# Patient Record
Sex: Male | Born: 1980 | Race: Black or African American | Hispanic: No | Marital: Single | Smoking: Former smoker
Health system: Southern US, Community
[De-identification: ages and names within clinical notes are randomized; demographics above are authoritative.]

## PROBLEM LIST (undated history)

## (undated) DIAGNOSIS — I509 Heart failure, unspecified: Secondary | ICD-10-CM

## (undated) DIAGNOSIS — F1910 Other psychoactive substance abuse, uncomplicated: Secondary | ICD-10-CM

## (undated) DIAGNOSIS — I10 Essential (primary) hypertension: Secondary | ICD-10-CM

## (undated) HISTORY — DX: Other psychoactive substance abuse, uncomplicated: F19.10

---
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
## 2005-07-12 ENCOUNTER — Emergency Department (HOSPITAL_COMMUNITY): Admission: EM | Admit: 2005-07-12 | Discharge: 2005-07-12 | Payer: Self-pay | Admitting: Family Medicine

## 2005-07-14 ENCOUNTER — Emergency Department (HOSPITAL_COMMUNITY): Admission: EM | Admit: 2005-07-14 | Discharge: 2005-07-14 | Payer: Self-pay | Admitting: Family Medicine

## 2006-01-20 ENCOUNTER — Encounter: Payer: Self-pay | Admitting: Emergency Medicine

## 2008-02-09 ENCOUNTER — Emergency Department (HOSPITAL_COMMUNITY): Admission: EM | Admit: 2008-02-09 | Discharge: 2008-02-09 | Payer: Self-pay | Admitting: Emergency Medicine

## 2008-02-10 ENCOUNTER — Inpatient Hospital Stay (HOSPITAL_COMMUNITY): Admission: EM | Admit: 2008-02-10 | Discharge: 2008-02-20 | Payer: Self-pay | Admitting: Emergency Medicine

## 2008-02-13 ENCOUNTER — Ambulatory Visit: Payer: Self-pay | Admitting: Infectious Disease

## 2008-03-05 ENCOUNTER — Inpatient Hospital Stay (HOSPITAL_COMMUNITY): Admission: EM | Admit: 2008-03-05 | Discharge: 2008-03-08 | Payer: Self-pay | Admitting: Family Medicine

## 2009-08-04 IMAGING — CT CT EXTREM LOW W/ CM*R*
3 of 4 series · 16 of 33 positions shown, 19 images · IV contrast (agent unspecified)
Comparison: 02/16/2008

CLINICAL DATA: Recent discharge for cellulitis.  Worsening
swelling.

CT RIGHT LOWER EXTREMITY WITH CONTRAST
TECHNIQUE: Multidetector CT imaging of the right lower extremity
was performed according to the standard protocol following
intravenous contrast administration. Multiplanar CT image
reconstructions were also generated.
Contrast: 100 ml 1mnipaque-IJJ

[Series 5: lowextremity 3.0 b20s · axial · 0.42mm/px · z∈[-1060,-672]mm · 8 of 159 slices shown, 10 images]
[im 15/159  soft-tissue]
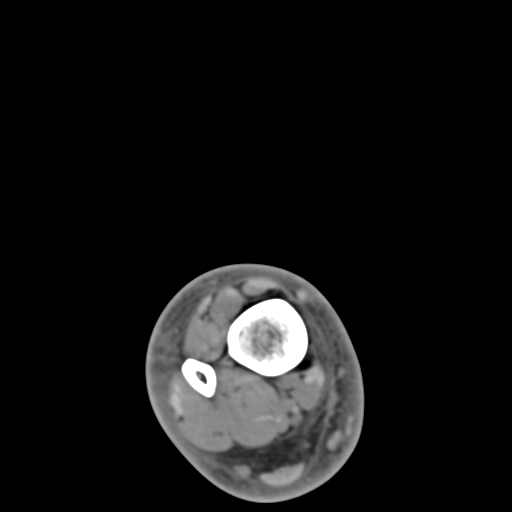
[im 15/159  bone]
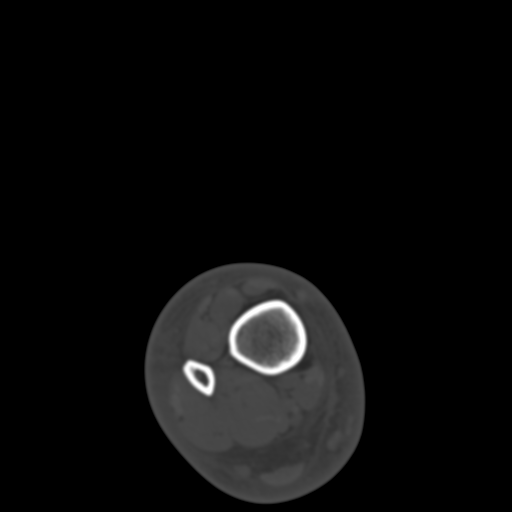
[im 29/159  bone]
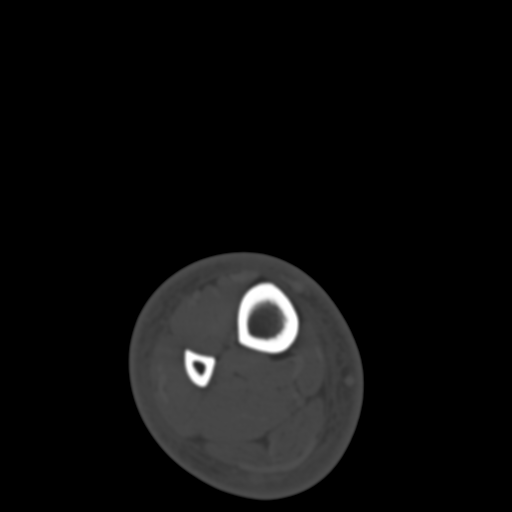
[im 58/159  bone]
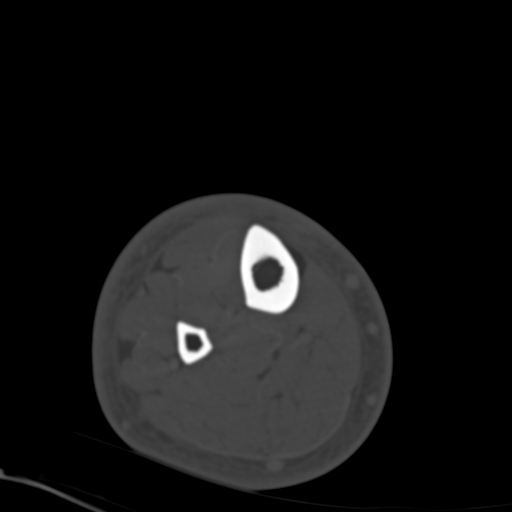
[im 72/159  bone]
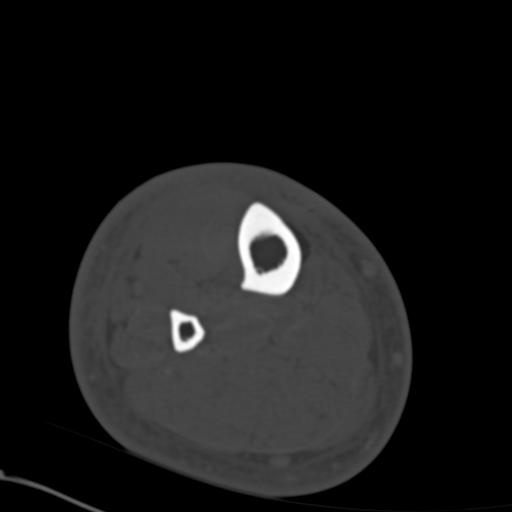
[im 87/159  soft-tissue]
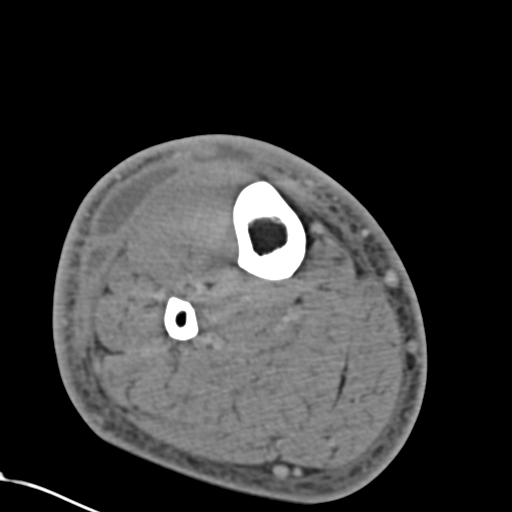
[im 87/159  bone]
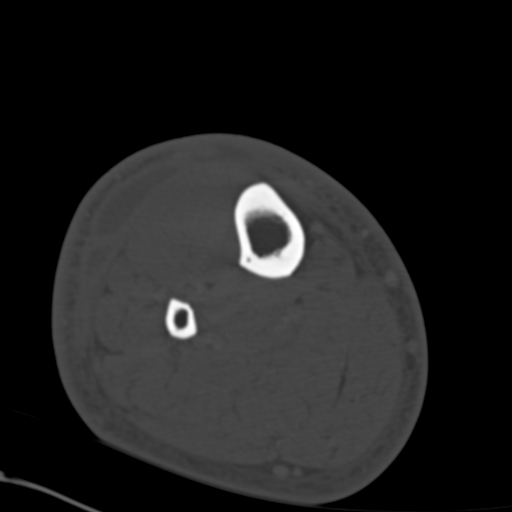
[im 101/159  bone]
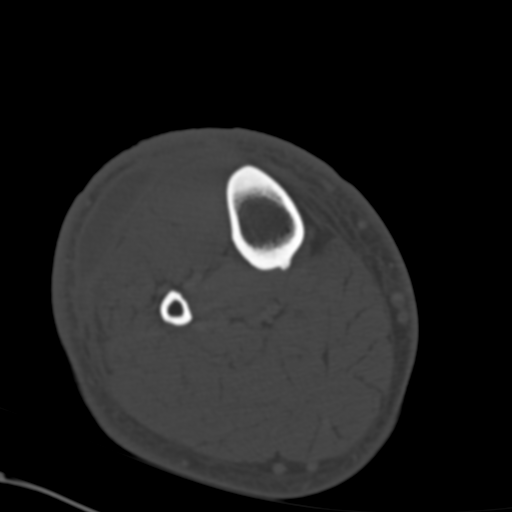
[im 130/159  bone]
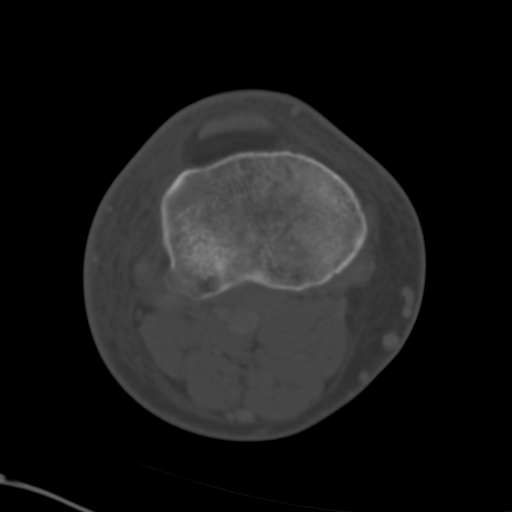
[im 144/159  bone]
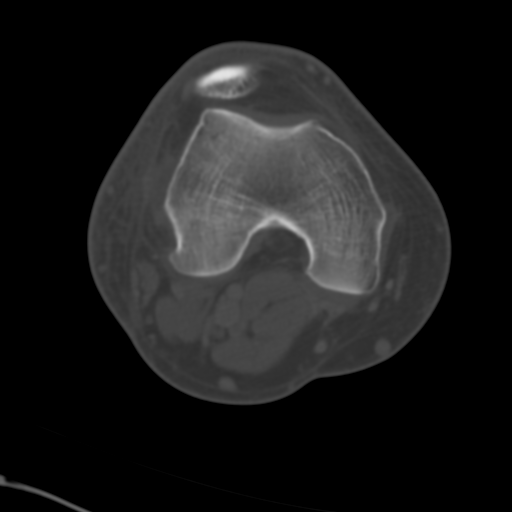

[Series 602: coronal soft tissue · coronal · 0.93mm/px · 3 of 53 slices shown]
[im 11/53  bone]
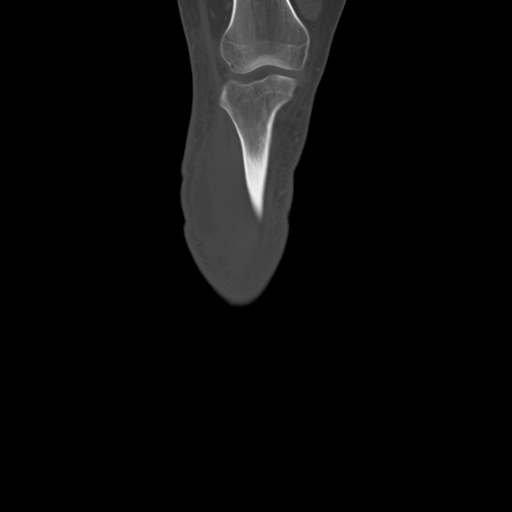
[im 21/53  bone]
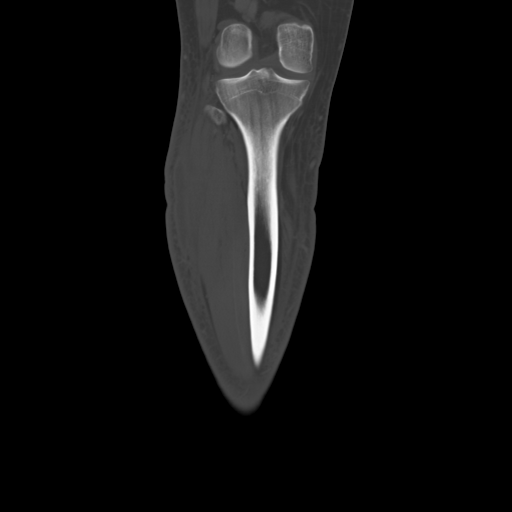
[im 32/53  bone]
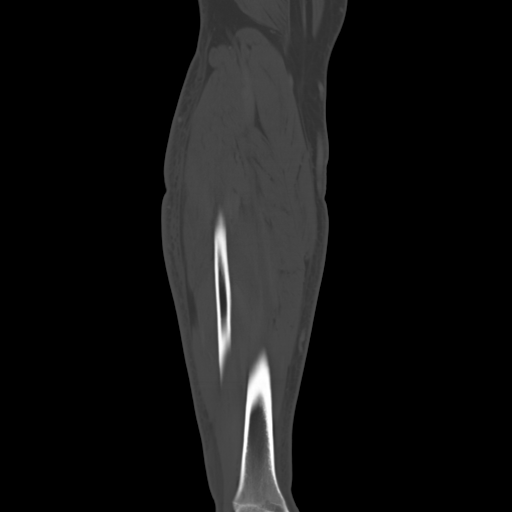

[Series 603: sagittal soft tissue · sagittal · 0.93mm/px · 5 of 49 slices shown, 6 images]
[im 17/49  bone]
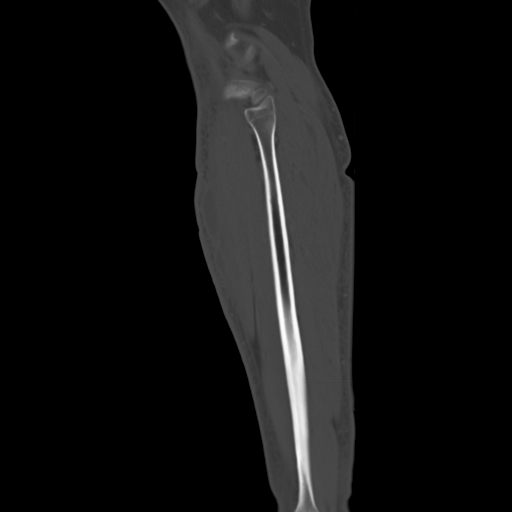
[im 21/49  bone]
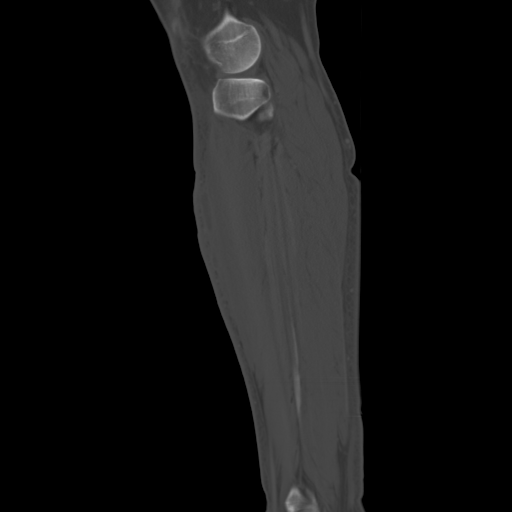
[im 25/49  soft-tissue]
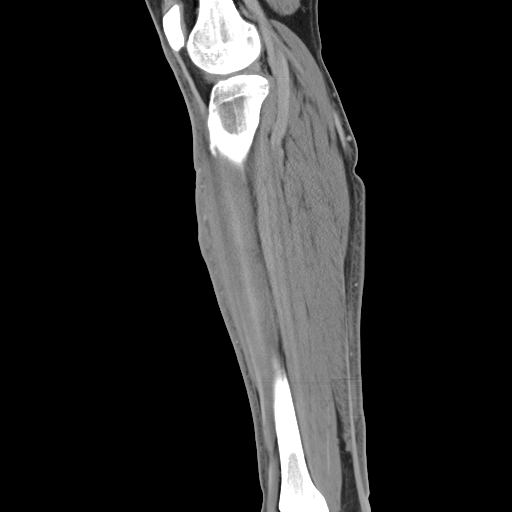
[im 25/49  bone]
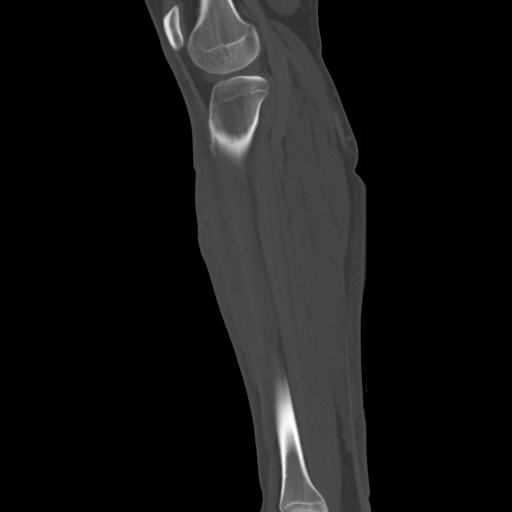
[im 29/49  bone]
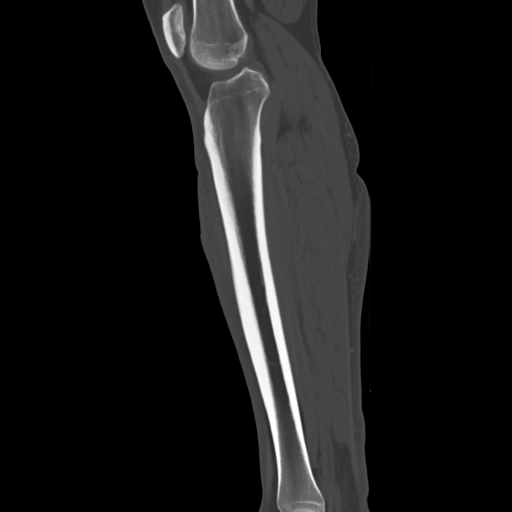
[im 33/49  bone]
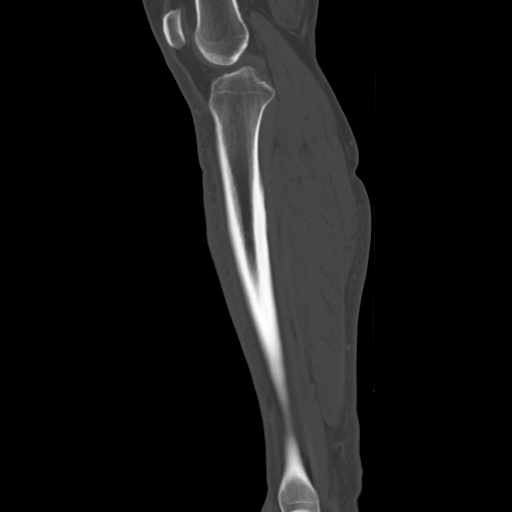

[16 of 33 positions shown; findings below may reference images not displayed]

FINDINGS: There is a tiny amount of joint fluid in the knee, not
particularly worrisome.  There is diffuse subcutaneous edema
throughout the remainder of the lower leg.  Anterolateral, within
the subcutaneous tissues, there is a crescentic low density
collection extending over a length of 13 cm with maximal transverse
diameter of 1.2 x 6 cm.  This is consistent with abscess formation.
This does not appear to involve the deep compartments.  No sign of
osteomyelitis.
IMPRESSION: Diffuse subcutaneous edema.  Nonenhancing fluid collection
anterolateral in the subcutaneous region in the proximal portion of
the lower leg consistent with an abscess.  This extends over a
cephalocaudal length of 13 cm and has a transverse diameter of
x 6 cm.

## 2010-03-31 ENCOUNTER — Emergency Department (HOSPITAL_COMMUNITY): Admission: EM | Admit: 2010-03-31 | Discharge: 2010-03-31 | Payer: Self-pay | Admitting: Family Medicine

## 2011-02-03 NOTE — H&P (Signed)
NAME:  Mitchell Ochoa, LOR NO.:  [id number]   MEDICAL RECORD NO.:  [id number]          PATIENT TYPE:  OBV   LOCATION:  5001                         FACILITY:  [facility]   PHYSICIAN:  Vanita Panda. Magnus Ivan, M.D.DATE OF BIRTH:  1981/05/30   DATE OF ADMISSION:  03/05/2008  DATE OF DISCHARGE:                              HISTORY & PHYSICAL   ADMITTING DIAGNOSIS:  Right leg abscess.   HISTORY OF PRESENT ILLNESS:  Briefly, Mitchell Ochoa is a 30 year old male who  was in the hospital on the General Medicine Service from late May all  the way up until February 20, 2008.  This was originally for a right leg  cellulitis.  He was on several antibiotics including Zyvox and was  eventually discharged on February 20, 2008.  His discharge medications  included doxycycline and Keflex.  He had not had a followup performed by  either the Infectious Disease Clinic or his medicine physician.  At 30  years old, he is an obese individual and had also been diagnosed with  high blood pressure and was started on hydrochlorothiazide.  Today, he  presented to the emergency room with worsening pain in his leg and some  tightness.  He states he do not really feel sick and the pain was only  minimal, but a CAT scan was obtained.  This was read as likely abscess  now in this area of his right leg and Orthopedic Surgery was consult.  I  was on unassigned call who was given the consultation.  Apparently, the  notes from his previous discharge showed that he had an orthopedic  consultation at that time, but it was unsure of who saw him, and since I  was on an unassigned call, this defaulted to me.  I talked with the  patient in length about his findings and if he had any other potential  wounds anywhere out of his body and he said he was doing well otherwise.   PAST MEDICAL HISTORY:  1. High blood pressure  2. Cellulitis, right leg.   ALLERGIES:  No known drug allergies.   MEDICATIONS:  1. Hydrochlorothiazide.  2.  Doxycycline.  3. Keflex.   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  He does smoke and drinks occasionally.   REVIEW OF SYSTEMS:  Negative for chest pain, shortness of breath, fever,  chills, nausea, or vomiting.   PHYSICAL EXAMINATION:  VITAL SIGNS:  His temperature is 97, his blood  pressure is 160/102, heart rate 70, and respiratory rate 16.  GENERAL:  He is alert and oriented 30 year old male in no acute distress  and only mild discomfort.  HEENT:  Normocephalic and atraumatic.  Pupils are equal, round, and  reactive to light.  LUNGS:  Clear to auscultation bilaterally.  HEART:  Regular rate and rhythm.  ABDOMEN:  Obese.  EXTREMITY:  Right lower extremity shows an orange-peel type of surface  to the anterior lateral tibia.  There is a small wound in this area that  is draining light fluid.  He can feel that it is  tighter on the  anterolateral aspect of the tibia.  He has good function of his knee,  foot, and ankle.  His foot has normal sensation.  He moves his toes  easily.   The CAT scan is reviewed and compared to previous studies and does show  collection of fluid in the subcutaneous and deeper tissues of the  anterolateral tibia proximally.  This does not appear to communicate  with bone and is consistent with an abscess.   LABORATORY STUDIES:  His white blood count is only 5.6.   IMPRESSION:  This is a 30 year old with a questionable right proximal  leg abscess.   PLAN:  I talked to him at length about the need for irrigation and  debridement considering this as a fluid collection there.  I talked with  his mother about this as well and they agreed for me to proceed with  surgery.  He will be admitted to the regular floor bed following surgery  with a drain in place.  Cultures will be taken and pending.  However, he  has been on antibiotics for a while now, so I will not be surprised if  the cultures end up being  negative.  I will obtain an ID consult as  appropriate and leave the drain in for a day or two.      Vanita Panda. Magnus Ivan, M.D.  Electronically Signed     CYB/MEDQ  D:  03/05/2008  T:  03/06/2008  Job:  [id number]

## 2011-02-03 NOTE — H&P (Signed)
NAME:  Mitchell Ochoa, Mitchell Ochoa NO.:  [id number]   MEDICAL RECORD NO.:  [id number]          PATIENT TYPE:  INP   LOCATION:  5001                         FACILITY:  [facility]   PHYSICIAN:  Della Goo, M.D. DATE OF BIRTH:  [birth year]   DATE OF ADMISSION:  02/10/2008  DATE OF DISCHARGE:                              HISTORY & PHYSICAL   ADMISSION DATE:  Feb 10, 2008.   CHIEF COMPLAINT:  Redness, swelling, right lower leg.   HISTORY OF PRESENT ILLNESS:  This is a 30 year old male presenting to  the emergency department with complaints of increased redness, swelling  and pain of the right lower leg over the past 4 days.  He reports the  swelling has been progressive.  He reports having a possible spider  bite/insect bite approximately 6 days ago.  He reports having fevers and  chills along with nausea and malaise and poor appetite.   PAST MEDICAL HISTORY:  Significant for asthma.   MEDICATIONS AT THIS TIME:  None.   ALLERGIES:  NO KNOWN DRUG ALLERGIES.   PAST SURGICAL HISTORY:  History of a tonsillectomy and adenoidectomy,  history of an umbilical hernia repair at age 18.   SOCIAL HISTORY:  The patient is a nonsmoker and he reports drinking one  40 ounce beer daily.   FAMILY HISTORY:  Noncontributory.   PHYSICAL EXAMINATION FINDINGS:  This is a 30 year old obese male in  discomfort but no acute distress.  His vital signs are a temperature of  102.6, blood pressure 127/66, heart rate initially 140, respirations 22.  HEENT:  Normocephalic, atraumatic.  Pupils equally round, reactive to  light.  Extraocular muscles are intact.  Funduscopic benign.  Oropharynx  is clear.  NECK:  Supple.  Full range of motion.  No thyromegaly, adenopathy or  jugular venous distention.  CARDIOVASCULAR:  Regular rate and rhythm.  No murmurs, gallops or rubs.  LUNGS:  Clear to auscultation bilaterally.  ABDOMEN:  Positive bowel sounds, soft, nontender, nondistended.  EXTREMITIES:  Without  cyanosis, clubbing or edema except for the right  lower extremity which has confluent erythema and edema of the right calf  area.  There is a punctum at the upper third of the lateral calf area.  There is no drainage from the punctum.  This area is painful to  palpation and tense.  The patient does have full range of motion of the  right knee and right ankle, however, has pain with movement.  NEUROLOGIC:  Examination nonfocal.   LABORATORY STUDIES:  White blood cell count 20, hemoglobin 16,  hematocrit 47, platelets 258, neutrophils 94%, lymphocytes 3%.  Sodium  135, potassium 3.4, chloride 99, bicarb 22, BUN 15, creatinine 1.6 and  glucose 139.   ASSESSMENT:  A 30 year old male being admitted with:  1. Early sepsis.  2. Cellulitis, right lower extremity, possible spider bite versus      insect bite.  3. Mild dehydration.  4. Mild hypokalemia.   PLAN:  Patient has been administered IV antibiotic therapy that started  with IV vancomycin.  IV Cipro has been ordered as well and a dose of  IV  Solu-Medrol will be given with a steroid taper.  The patient will be  placed on IV fluids for hydration.  The electrolytes and CBC will be  monitored.  DVT and GI prophylaxis will also be ordered.  The patient's  antibiotic therapy will be further adjusted pending his clinical course.      Della Goo, M.D.  Electronically Signed     HJ/MEDQ  D:  02/11/2008  T:  02/11/2008  Job:  [id number]

## 2011-02-03 NOTE — Op Note (Signed)
NAME:  Mitchell Ochoa, Mitchell Ochoa NO.:  [id number]   MEDICAL RECORD NO.:  [id number]          PATIENT TYPE:  INP   LOCATION:  5001                         FACILITY:  [facility]   PHYSICIAN:  Vanita Panda. Magnus Ivan, M.D.DATE OF BIRTH:  07-13-1981   DATE OF PROCEDURE:  03/07/2008  DATE OF DISCHARGE:                               OPERATIVE REPORT   PREOPERATIVE DIAGNOSIS:  Right tibia wound with hematoma status post  irrigation and debridement of abscess.   POSTOPERATIVE DIAGNOSIS:  Right tibia wound with hematoma status post  irrigation and debridement of abscess.   PROCEDURE:  Evacuation of hematoma right leg with repeat irrigation  debridement of right tibia abscess.   SURGEON:  Vanita Panda. Magnus Ivan, MD   ANESTHESIA:  General.   BLOOD LOSS:  Minimal.   COMPLICATIONS:  None.   INDICATIONS:  Briefly, Mitchell Ochoa is a 30 year old who was taken to the  operating room 48 hours ago for irrigation and debridement of right  proximal tibia abscesses in the soft tissues and I was able to irrigate  and debrided this area, then place a drain in the wound, and yesterday  due to minimal output of the drain, we removed the drain.  I kept him in  the hospital on IV antibiotics since the day on assessment of the wound,  it looked like that he had a formed hematoma in the dead space.  The  remainder of his leg exam although was improving in terms of his skin.  He had intact motor and sensory exam and pulses in his foot.  I  recommended due to the size of hematoma and the fact that this was a  infection that had been going on for a while that we performed a repeat  irrigation and debridement and evacuation of the hematoma as well.  All  the risks and benefits of this were explained to him at length and well  understood, and he agreed to proceed with surgery.   PROCEDURE IN DETAIL:  After informed consent was obtained, appropriate  right leg was marked.  He was brought to the operating room  and placed  supine on the operating table.  General anesthesia was then obtained.  His leg was prepped and draped with Betadine and scrub and paint  including a sterile stockinette.  I then removed the five sutures that  were in the anterolateral incision of his proximal tibia and there was a  large hematoma encountered.  I evacuated the hematoma and then used a 3  L of normal saline solution using pulsatile lavage to completely clean  the wound.  Once this was done, tight balloon with dry gauze and then  removed the gauze.  I watched the wound the  for several minutes and  there was no recurrent pooling blood was formed, I then felt comfortable  to close the wound with interrupted 2-0 nylon suture.  I then placed a  xeroform followed by well-padded sterile dressing and a compressive  staple dressing with Thera-Band around the wound.  He was awakened,  extubated, and taken to  recovery room in stable condition.  All final counts were correct.  There no complications noted.  Postoperatively, I assessed his foot and  he had no pulses in his foot, normal sensation and moved his toes  easily.  He said the dressing was not too tight.  I will continue to  wash this for several days to make sure that this continues to resolve.      Vanita Panda. Magnus Ivan, M.D.  Electronically Signed     CYB/MEDQ  D:  03/07/2008  T:  03/08/2008  Job:  [id number]

## 2011-02-03 NOTE — Discharge Summary (Signed)
NAME:  Mitchell Ochoa, Mitchell Ochoa NO.:  [id number]   MEDICAL RECORD NO.:  [id number]          PATIENT TYPE:  INP   LOCATION:  5001                         FACILITY:  [facility]   PHYSICIAN:  Vanita Panda. Magnus Ivan, M.D.DATE OF BIRTH:  05/29/1981   DATE OF ADMISSION:  03/05/2008  DATE OF DISCHARGE:  03/08/2008                               DISCHARGE SUMMARY   ADMITTING DIAGNOSIS:  Right leg cellulitis and abscess.   DISCHARGE DIAGNOSIS:  Right leg cellulitis and abscess.   PROCEDURES:  Irrigation and debridement x2 of right leg abscess on March 05, 2008 and March 07, 2008.   HOSPITAL COURSE:  Briefly, [name] is a 30 year old who had a  recent hospital admission for cellulitis involving his right leg.  He  was discharged on February 20, 2008, from the medicine service.  He then  returned to the ER on  March 05, 2008, with worsening swelling in his leg  and a CT scan was obtained.  It was consistent with abscess and fluid  collection in the superficial and deep tissues of the right leg,  anterolateral to the tibia.  It does not appear to involve the bone.  He  was admitted to the orthopedic surgery service and was taken for surgery  that night and I was able to open up the anterolateral aspect of his leg  and drain fluid from this.  Cultures were obtained but they were  negative for infection but he had been on antibiotics for quite some  time.  After thoroughly irrigating the wound, I closed it over a drain.  After 48 hours, he came back to the operating room for repeat irrigation  and debridement.  By day of discharge, his cellulitis had resolved and  he was feeling much better with minimal  pain.  The cultures were still  negative.  It was felt that he could be discharged home with oral  antibiotics.   DISPOSITION:  To home.   DISCHARGE INSTRUCTIONS:  While he is at home, he will keep the incision  clean and dry and wait to shower for 4-5 days after discharge.  Followup  appointment will be established in my office at [facility] on  this coming Monday, which is 4 days from now.  We are going to keep him  on Keflex and doxycycline, and he will have Vicodin for pain.  He will  continue his hydrochlorothiazide as well.  He has been given numbers to  contact us, if his situation seems to be worsening whatsoever.      Vanita Panda. Magnus Ivan, M.D.  Electronically Signed     CYB/MEDQ  D:  03/08/2008  T:  03/08/2008  Job:  [id number]

## 2011-02-03 NOTE — Discharge Summary (Signed)
NAME:  Mitchell Ochoa, Mitchell Ochoa NO.:  [id number]   MEDICAL RECORD NO.:  [id number]          PATIENT TYPE:  INP   LOCATION:  5001                         FACILITY:  [facility]   PHYSICIAN:  Beckey Rutter, MD  DATE OF BIRTH:  1981/01/07   DATE OF ADMISSION:  02/10/2008  DATE OF DISCHARGE:  02/20/2008                               DISCHARGE SUMMARY   PRIMARY CARE PHYSICIAN:  Unassigned.   CHIEF COMPLAINT/HISTORY OF PRESENT ILLNESS:  A 30 year old very pleasant  African American male with no significant medical history, admitted on  Feb 10, 2008, because of redness and swelling on the right lower leg.   The patient was diagnosed with cellulitis of right lower extremity,  possible spider bite versus insect bite.   HOSPITAL COURSE:  1. Cellulitis.  The patient was started on intravenous vancomycin on      admission together with intravenous steroids.  The patient was      started on hydration as well.  Because the cellulitis not      responding well, it was changed to Zyvox, and the patient was      continued on Zyvox for 11 days as per the Infectious Disease      recommendation.  Today, the patient's cellulitis improved, and he      was felt to be stable for discharge to continue on oral antibiotics      with doxycycline and Keflex.  The patient was seen by Orthopedics      on consultation during the hospital stay as well.  2. Hypertension.  The patient's blood pressure was high during the      hospital stay.  For the last 2 days, he was started on      hydrochlorothiazide with good response to his blood pressure.  The      patient will be discharged today with hydrochlorothiazide to follow      up with the primary for further management of his blood pressure.   HOSPITAL CONSULTATIONS:  1. Infectious Disease, Acey Lav, MD  2. Orthopedics consultation.   DISCHARGE DIAGNOSES:  1. Right lower extremity cellulitis secondary to methicillin-resistant  Staphylococcus aureus, requiring prolonged intravenous antibiotic.  2. Hypertension.   DISCHARGE MEDICATIONS:  1. Doxycycline 100 mg p.o. b.i.d. for 7 days.  2. Keflex 500 mg p.o. every 6 hours for 7 days.  3. Hydrochlorothiazide 25 mg p.o. daily.   HOSPITAL PROCEDURES:  1. X-ray of tibia and fibula on the right done on the day of the      presentation, Feb 11, 2008.  Impression was showing soft tissue      swelling.  No fracture or effusion and no osseous lesions.  2. The patient had a CAT scan on Feb 12, 2008.  Impression was:      a.     Considerable subcutaneous edema in the right calf compatible       with cellulitis.  No specific finding of myosis or abscess.      b.     A small effusion of the right suprapatellar bursa laterally.  3. The patient  had CT of the lower extremities on Feb 16, 2008.      Impression was reading progressive cellulitis.  Stable right knee      effusion.   DISCHARGE PLAN:  The patient is stable for discharge today.  It is  recommended the patient to follow up with the primary physician to  further evaluate and manage hypertension.  The patient also is  recommended to follow up with the doctor before his antibiotic is  finished.  He is aware and agreeable to discharge plan.      Beckey Rutter, MD  Electronically Signed     EME/MEDQ  D:  02/20/2008  T:  02/21/2008  Job:  [id number]   cc:   Acey Lav, MD

## 2011-02-03 NOTE — Op Note (Signed)
NAME:  SELIG, [name] NO.:  [id number]   MEDICAL RECORD NO.:  [id number]          PATIENT TYPE:  OBV   LOCATION:  5001                         FACILITY:  [facility]   PHYSICIAN:  Vanita Panda. Magnus Ivan, M.D.DATE OF BIRTH:  04/17/1981   DATE OF PROCEDURE:  DATE OF DISCHARGE:                               OPERATIVE REPORT   PREOPERATIVE DIAGNOSIS:  Right leg abscess.   POSTOPERATIVE DIAGNOSIS:  Right leg abscess.   FINDINGS:  Right leg fluid collection in anterolateral tibia,  superficial and deep tissues.   PROCEDURE:  Irrigation and debridement of right leg abscess.   SURGEON:  Vanita Panda. Magnus Ivan, MD   ANESTHESIA:  General.   ANTIBIOTICS:  600 mg IV clindamycin.   BLOOD LOSS:  100 mL.   COMPLICATIONS:  None.   INDICATIONS:  Briefly, Mitchell Ochoa is a 30 year old who was in hospital  between May and June 1 due to right leg cellulitis.  He is a morbidly  obese individual and had a questionable insect bite versus another  source of infection.  A previous CT scan did not show any evidence of a  abscess.  He was on Zyvox and was discharged by the medicine service on  June 1 with doxycycline and Keflex.  Due to continued pain in his leg  today, he reported to the emergency room, got CT scan and scan was  obtained of the right lower extremity and compared to the last CT scan,  you can see that there was an obvious fluid collection consistent with  an abscess.  A peripheral white blood cell count was only 5000 and he  was not feeling sick other than just pain in his leg.  He was  recommended that he undergo the irrigation and debridement of this area.  The risks and benefits of this were explained to him at length and he  agreed proceed with surgery.   PROCEDURE:  After informed consent was obtained and appropriate right  leg was marked, he was brought to the operating room and placed supine  on the operating table.  General anesthesia was then obtained.  A  time-  out was called to identify the correct patient  and correct extremity.  I then made an incision over the anterolateral tibia at the proximal one-  third and I dissected down through the soft tissues and there was a  large fluid collection that was encountered.  I did obtain cultures from  this.  However, he has been on antibiotics for several weeks now.  I  thoroughly irrigated out this tissue and then used pulsatile lavage of  300 mL of normal saline solution followed by 500 mL of bacitracin  solution.  I then placed in a medium Hemovac in this wound area and  closed the skin with  interrupted 2-0 nylon suture.  Xeroform followed by a well-padded  sterile dressing was applied.  He was awakened, extubated, and taken to  recovery room in stable condition.  Postoperatively, he was admitted to  the orthopedic floor and I will start him on continued antibiotics with  potential consultation for infectious  disease.      Vanita Panda. Magnus Ivan, M.D.  Electronically Signed     CYB/MEDQ  D:  03/05/2008  T:  03/06/2008  Job:  [id number]

## 2011-06-17 LAB — BASIC METABOLIC PANEL
BUN: 16
BUN: 6
BUN: 7
BUN: 9
CO2: 24
CO2: 24
CO2: 25
CO2: 26
Calcium: 8.2 — ABNORMAL LOW
Chloride: 101
Chloride: 102
Chloride: 103
Chloride: 104
Creatinine, Ser: 0.97
Creatinine, Ser: 1.38
GFR calc non Af Amer: >60
GFR calc non Af Amer: >60
GFR calc non Af Amer: >60
Glucose, Bld: 103 — ABNORMAL HIGH
Glucose, Bld: 108 — ABNORMAL HIGH
Glucose, Bld: 142 — ABNORMAL HIGH
Glucose, Bld: 74
Glucose, Bld: 91
Potassium: 3.1 — ABNORMAL LOW
Potassium: 3.3 — ABNORMAL LOW
Potassium: 3.5
Potassium: 3.6
Potassium: 3.6
Potassium: 4
Sodium: 135
Sodium: 136
Sodium: 136
Sodium: 138

## 2011-06-17 LAB — DIFFERENTIAL
Basophils Relative: 0
Basophils Relative: 0
Eosinophils Absolute: 0.2
Eosinophils Relative: 0
Eosinophils Relative: 0
Eosinophils Relative: 1
Lymphs Abs: 0.4 — ABNORMAL LOW
Lymphs Abs: 0.8
Monocytes Absolute: 0.4
Monocytes Absolute: 0.6
Monocytes Absolute: 0.8
Monocytes Relative: 3
Monocytes Relative: 3
Neutro Abs: 18.7 — ABNORMAL HIGH
Neutro Abs: 25.1 — ABNORMAL HIGH
Neutrophils Relative %: 94 — ABNORMAL HIGH
WBC Morphology: INCREASED

## 2011-06-17 LAB — CBC
HCT: 34 — ABNORMAL LOW
HCT: 35.5 — ABNORMAL LOW
HCT: 35.7 — ABNORMAL LOW
HCT: 36.2 — ABNORMAL LOW
HCT: 36.2 — ABNORMAL LOW
HCT: 42.3
Hemoglobin: 11.5 — ABNORMAL LOW
Hemoglobin: 12.5 — ABNORMAL LOW
Hemoglobin: 12.6 — ABNORMAL LOW
Hemoglobin: 14.9
Hemoglobin: 16
MCHC: 34
MCHC: 35
MCHC: 35.1
MCHC: 35.3
MCV: 89.2
MCV: 89.8
MCV: 90.1
MCV: 90.2
MCV: 90.2
MCV: 90.3
MCV: 90.5
Platelets: 198
Platelets: 201
Platelets: 234
RBC: 4.02 — ABNORMAL LOW
RBC: 4.67
RBC: 5.27
RDW: 15.4
RDW: 15.6 — ABNORMAL HIGH
RDW: 15.7 — ABNORMAL HIGH
RDW: 15.8 — ABNORMAL HIGH
RDW: 16.1 — ABNORMAL HIGH
WBC: 22.6 — ABNORMAL HIGH
WBC: 26.7 — ABNORMAL HIGH

## 2011-06-17 LAB — CULTURE, BLOOD (ROUTINE X 2): Culture: NO GROWTH

## 2011-06-17 LAB — CK: Total CK: 1568 — ABNORMAL HIGH

## 2011-06-17 LAB — HEPATITIS PANEL, ACUTE
Hep A IgM: NEGATIVE
Hep B C IgM: NEGATIVE

## 2011-06-17 LAB — POCT I-STAT, CHEM 8
BUN: 15
Chloride: 99
Creatinine, Ser: 1.6 — ABNORMAL HIGH
Sodium: 135
TCO2: 22

## 2011-06-18 LAB — CBC
HCT: 36 — ABNORMAL LOW
MCV: 90.8
Platelets: 456 — ABNORMAL HIGH
Platelets: 321
RDW: 15.7 — ABNORMAL HIGH
RDW: 15.5

## 2011-06-18 LAB — BASIC METABOLIC PANEL
BUN: 6
CO2: 31
Calcium: 9.1
Chloride: 105
Creatinine, Ser: 0.88
GFR calc Af Amer: >60
Glucose, Bld: 91
Potassium: 3.8
Sodium: 137

## 2011-06-18 LAB — DIFFERENTIAL
Basophils Absolute: 0
Eosinophils Relative: 10 — ABNORMAL HIGH
Lymphocytes Relative: 39
Neutro Abs: 2.3
Neutrophils Relative %: 42 — ABNORMAL LOW

## 2011-06-18 LAB — CULTURE, ROUTINE-ABSCESS
Culture: NO GROWTH
Gram Stain: NONE SEEN

## 2011-06-18 LAB — ANAEROBIC CULTURE

## 2019-02-19 ENCOUNTER — Emergency Department (HOSPITAL_COMMUNITY): Payer: Self-pay

## 2019-02-19 ENCOUNTER — Encounter (HOSPITAL_COMMUNITY): Payer: Self-pay | Admitting: Emergency Medicine

## 2019-02-19 ENCOUNTER — Other Ambulatory Visit: Payer: Self-pay

## 2019-02-19 ENCOUNTER — Emergency Department (HOSPITAL_COMMUNITY)
Admission: EM | Admit: 2019-02-19 | Discharge: 2019-02-19 | Disposition: A | Discharge status: Home / Self Care | Payer: Self-pay | Attending: Emergency Medicine | Admitting: Emergency Medicine

## 2019-02-19 DIAGNOSIS — Y9389 Activity, other specified: Secondary | ICD-10-CM | POA: Insufficient documentation

## 2019-02-19 DIAGNOSIS — S60221A Contusion of right hand, initial encounter: Secondary | ICD-10-CM | POA: Insufficient documentation

## 2019-02-19 DIAGNOSIS — Y999 Unspecified external cause status: Secondary | ICD-10-CM | POA: Insufficient documentation

## 2019-02-19 DIAGNOSIS — I10 Essential (primary) hypertension: Secondary | ICD-10-CM | POA: Insufficient documentation

## 2019-02-19 DIAGNOSIS — F17200 Nicotine dependence, unspecified, uncomplicated: Secondary | ICD-10-CM | POA: Insufficient documentation

## 2019-02-19 DIAGNOSIS — S62356A Nondisplaced fracture of shaft of fifth metacarpal bone, right hand, initial encounter for closed fracture: Secondary | ICD-10-CM | POA: Insufficient documentation

## 2019-02-19 DIAGNOSIS — W228XXA Striking against or struck by other objects, initial encounter: Secondary | ICD-10-CM | POA: Insufficient documentation

## 2019-02-19 DIAGNOSIS — Y929 Unspecified place or not applicable: Secondary | ICD-10-CM | POA: Insufficient documentation

## 2019-02-19 NOTE — ED Triage Notes (Signed)
Takes BP medicine but does not take it.

## 2019-02-19 NOTE — Progress Notes (Signed)
Orthopedic Tech Progress Note Patient Details:  Mitchell Ochoa [birth year] [id number]  Ortho Devices Type of Ortho Device: Ace wrap, Ulna gutter splint Ortho Device/Splint Location: Right Ortho Device/Splint Interventions: Application   Post Interventions Patient Tolerated: Well Instructions Provided: Care of device   [name] 02/19/2019, 10:04 AM

## 2019-02-19 NOTE — ED Notes (Signed)
Patient verbalizes understanding of discharge instructions . Opportunity for questions and answers were provided . Armband removed by staff ,Pt discharged from ED. W/C  offered at D/C  and Declined W/C at D/C and was escorted to lobby by RN.  

## 2019-02-19 NOTE — Discharge Instructions (Addendum)
You were seen in the emergency department for right hand pain.  X-ray showed a fracture of your fifth metacarpal.  Initial treatment is splint application, elevation and ibuprofen/acetaminophen every 6-8 hours to help with pain, inflammation and swelling.  I spoke to Dr. Eulah Pont who will be able to follow-up with you in the office.  Call his office tomorrow to schedule an appointment.  Return to the ED if there is increasing pain, swelling, tingling or numbness to your fingertips.  Your blood pressure was elevated today.  In the past you have been prescribed medications for this but it does not seem like you are taking them.  You denied any headache, confusion, chest pain, shortness of breath, extremity swelling and further work-up or interventions were not needed for this.  You need to restart your blood pressure medicines.  Follow-up with your primary care doctor for further management of your blood pressure.  If you do not have a primary care doctor, call [facility] to make an appointment.  This clinic can see patients with no health insurance and has financial resources.

## 2019-02-19 NOTE — Progress Notes (Signed)
Orthopedic Tech Progress Note Patient Details:  Mitchell Ochoa 1980-12-09 [id number]  Ortho Devices Type of Ortho Device: Arm sling Ortho Device/Splint Location: Right Ortho Device/Splint Interventions: Application   Post Interventions Patient Tolerated: Well Instructions Provided: Care of device   [name] 02/19/2019, 10:21 AM

## 2019-02-19 NOTE — ED Triage Notes (Signed)
Ortho tech called 

## 2019-02-19 NOTE — ED Triage Notes (Signed)
Pt. Stated, I was working on car and the starter fell on my hand. That was 2 days ago  And its still swollen.

## 2019-02-19 NOTE — ED Provider Notes (Signed)
[facility] EMERGENCY DEPARTMENT Provider Note   CSN: [id number] Arrival date & time: 02/19/19  2706    History   Chief Complaint Chief Complaint  Patient presents with  . Hand Injury    HPI Mitchell Ochoa is a 38 y.o. male with h/o HTN here for evaluation of right hand pain.  States 2 days ago he was working on his car when the car starter fell directly on top of his right hand.  He had sudden pain.  Over the last couple days the pain is persisted and there is associated swelling.  He is right-hand dominant.  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 No interventions for this.  Pain is very mild and only with direct palpation and movement of the pinky finger.  He denies distal paresthesias, loss of sensation.  He can still use all his other fingers.     HPI  History reviewed. No pertinent past medical history.  There are no active problems to display for this patient.   History reviewed. No pertinent surgical history.      Home Medications    Prior to Admission medications   Not on File    Family History No family history on file.  Social History Social History   Tobacco Use  . Smoking status: Current Every Day Smoker  . Smokeless tobacco: Current User  Substance Use Topics  . Alcohol use: Not Currently  . Drug use: Yes    Types: Marijuana     Allergies   Patient has no allergy information on record.   Review of Systems Review of Systems  Musculoskeletal: Positive for arthralgias and joint swelling.  All other systems reviewed and are negative.    Physical Exam Updated Vital Signs BP (!) 179/120 (BP Location: Left Arm)   Pulse 74   Temp 98.2 F (36.8 C) (Oral)   Resp 16   Ht 6\' 2"  (1.88 m)   Wt (!) 163.3 kg   SpO2 98%   BMI 46.22 kg/m   Physical Exam Constitutional:      Appearance: He is well-developed. He is not toxic-appearing.  HENT:     Head: Normocephalic.     Right Ear: External ear normal.      Left Ear: External ear normal.     Nose: Nose normal.  Eyes:     Conjunctiva/sclera: Conjunctivae normal.  Neck:     Musculoskeletal: Full passive range of motion without pain.  Cardiovascular:     Rate and Rhythm: Normal rate.     Comments: Brisk cap refill to the right fingertips Pulmonary:     Effort: Pulmonary effort is normal. No tachypnea or respiratory distress.  Musculoskeletal: Normal range of motion.     Comments: Right hand: Obvious focal edema to the dorsal aspect of the fifth distal metacarpal with mild tenderness.  No other focal bony tenderness to the right digits or hands.  Full flexion, extension abduction and abduction of the fifth digit, pain with abduction and abduction.  Patient can make a full fist without significant deviation of the fifth digit.  Flexion extension of the fifth digit against resistance is intact.  Skin:    General: Skin is warm and dry.     Capillary Refill: Capillary refill takes less than 2 seconds.     Comments: No skin injury to the right hand  Neurological:     Mental Status: He is alert and oriented to person, place, and time.  Comments: Sensation to light touch intact in median, ulnar, radial nerve distribution in the right hand  Psychiatric:        Behavior: Behavior normal.        Thought Content: Thought content normal.      ED Treatments / Results  Labs (all labs ordered are listed, but only abnormal results are displayed) Labs Reviewed - No data to display  EKG None  Radiology Dg Hand Complete Right  Result Date: 02/19/2019 CLINICAL DATA:  Crush injury to right hand EXAM: RIGHT HAND - COMPLETE 3+ VIEW COMPARISON:  None. FINDINGS: Oblique non articular distal right fifth metacarpal fracture without significant displacement, with slight apex dorsal angulation and surrounding soft tissue swelling. No additional fracture. No dislocation. No suspicious focal osseous lesions. No radiopaque foreign body. Circumscribed small  round osseous fragments at the ulnar styloid, either congenital or from remote trauma. IMPRESSION: Distal right fifth metacarpal fracture as detailed. Electronically Signed   By: Delbert PhenixJason A Poff M.D.   On: 02/19/2019 09:18    Procedures Procedures (including critical care time)  Medications Ordered in ED Medications - No data to display   Initial Impression / Assessment and Plan / ED Course  I have reviewed the triage vital signs and the nursing notes.  Pertinent labs & imaging results that were available during my care of the patient were reviewed by me and considered in my medical decision making (see chart for details).  Clinical Course as of Feb 19 1151  Sun Feb 19, 2019  0923 FINDINGS: Oblique non articular distal right fifth metacarpal fracture without significant displacement, with slight apex dorsal angulation and surrounding soft tissue swelling. No additional fracture. No dislocation. No suspicious focal osseous lesions. No radiopaque foreign body. Circumscribed small round osseous fragments at the ulnar styloid, either congenital or from remote trauma.  DG Hand Complete Right [CG]    Clinical Course User Index [CG] Liberty HandyGibbons, Claudia J, PA-C       38 year old here with traumatic right hand pain.  X-ray shows right fifth metacarpal fracture without significant displacement and no articular involvement.  Exam as above is very benign, NVI.  No significant edema or loss of range of motion.  Patient was placed in an ulnar splint.  I notified Dr. Eulah PontMurphy with hand surgery about plan to have patient follow-up with him in the clinic.  He was in agreement with the plan of care.  Patient was discharged with supportive care including high-dose NSAIDs for pain and swelling, elevation and splint care instructions.  Patient was comfortable with this.  Of note, patient was noted to be hypertensive in the ER.  I addressed this with him and he denied any symptoms of hypertensive crisis such as  headache, vision changes, dizziness, chest pain, shortness of breath, extremity edema.  States he has been told in the past that he has high blood pressure but has not been taking any medicines for it.  Briefly discussed risk of chronic hypertension that is untreated.  Given lack of symptoms, I do not think emergent lab work, imaging or aggressive blood pressure control is indicated today.  Patient was comfortable with this.  I encouraged him to follow-up with [facility] to establish care with a PCP for ongoing management of his BP.  Return precautions discussed, patient is aware of symptoms of warrant immediate return to the ED.  Final Clinical Impressions(s) / ED Diagnoses   Final diagnoses:  Contusion of right hand, initial encounter  Closed nondisplaced fracture of shaft  of fifth metacarpal bone of right hand, initial encounter  Elevated blood pressure reading with diagnosis of hypertension    ED Discharge Orders    None       Liberty Handy, PA-C 02/19/19 1152    Derwood Kaplan, MD 02/20/19 1653

## 2019-02-19 NOTE — ED Triage Notes (Signed)
Pt in with R outer hand pain and swelling. 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 Area swollen, but pt has mobility in his pinky and is able to partially close fist.

## 2019-02-19 NOTE — ED Triage Notes (Signed)
Pt. Stated, I was given samples for BP but never took on regular basis. I don't have a Dr.

## 2019-11-03 ENCOUNTER — Emergency Department (HOSPITAL_COMMUNITY)
Admission: EM | Admit: 2019-11-03 | Discharge: 2019-11-04 | Discharge status: Against Medical Advice | Payer: No Typology Code available for payment source | Attending: Emergency Medicine | Admitting: Emergency Medicine

## 2019-11-03 DIAGNOSIS — M549 Dorsalgia, unspecified: Secondary | ICD-10-CM | POA: Insufficient documentation

## 2019-11-03 DIAGNOSIS — R0602 Shortness of breath: Secondary | ICD-10-CM | POA: Insufficient documentation

## 2019-11-03 DIAGNOSIS — R030 Elevated blood-pressure reading, without diagnosis of hypertension: Secondary | ICD-10-CM | POA: Diagnosis not present

## 2019-11-03 DIAGNOSIS — Z5320 Procedure and treatment not carried out because of patient's decision for unspecified reasons: Secondary | ICD-10-CM | POA: Insufficient documentation
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
## 2019-11-04 ENCOUNTER — Emergency Department (HOSPITAL_COMMUNITY): Payer: No Typology Code available for payment source

## 2019-11-04 LAB — COMPREHENSIVE METABOLIC PANEL
ALT: 29 U/L (ref 0–44)
AST: 30 U/L (ref 15–41)
Albumin: 4.3 g/dL (ref 3.5–5.0)
Alkaline Phosphatase: 42 U/L (ref 38–126)
Anion gap: 11 (ref 5–15)
BUN: 6 mg/dL (ref 6–20)
CO2: 24 mmol/L (ref 22–32)
Calcium: 9.2 mg/dL (ref 8.9–10.3)
Chloride: 105 mmol/L (ref 98–111)
Creatinine, Ser: 0.81 mg/dL (ref 0.61–1.24)
GFR calc Af Amer: >60 mL/min (ref >60)
GFR calc non Af Amer: >60 mL/min (ref >60)
Glucose, Bld: 87 mg/dL (ref 70–99)
Potassium: 4 mmol/L (ref 3.5–5.1)
Sodium: 140 mmol/L (ref 135–145)
Total Bilirubin: 0.9 mg/dL (ref 0.3–1.2)
Total Protein: 7.4 g/dL (ref 6.5–8.1)

## 2019-11-04 LAB — CBC WITH DIFFERENTIAL/PLATELET
Abs Immature Granulocytes: 0.01 10*3/uL (ref 0.00–0.07)
Basophils Absolute: 0 10*3/uL (ref 0.0–0.1)
Basophils Relative: 0 %
Eosinophils Absolute: 0.1 10*3/uL (ref 0.0–0.5)
Eosinophils Relative: 2 %
HCT: 49.9 % (ref 39.0–52.0)
Hemoglobin: 16.9 g/dL (ref 13.0–17.0)
Immature Granulocytes: 0 %
Lymphocytes Relative: 43 %
Lymphs Abs: 2.3 10*3/uL (ref 0.7–4.0)
MCH: 31.1 pg (ref 26.0–34.0)
MCHC: 33.9 g/dL (ref 30.0–36.0)
MCV: 91.9 fL (ref 80.0–100.0)
Monocytes Absolute: 0.5 10*3/uL (ref 0.1–1.0)
Monocytes Relative: 9 %
Neutro Abs: 2.5 10*3/uL (ref 1.7–7.7)
Neutrophils Relative %: 46 %
Platelets: 225 10*3/uL (ref 150–400)
RBC: 5.43 MIL/uL (ref 4.22–5.81)
RDW: 15.2 % (ref 11.5–15.5)
WBC: 5.4 10*3/uL (ref 4.0–10.5)
nRBC: 0 % (ref 0.0–0.2)

## 2019-11-04 LAB — ETHANOL: Alcohol, Ethyl (B): 13 mg/dL — ABNORMAL HIGH (ref <10)

## 2019-11-04 LAB — PROTIME-INR
INR: 1 (ref 0.8–1.2)
Prothrombin Time: 13.3 seconds (ref 11.4–15.2)

## 2019-11-04 MED ORDER — MORPHINE SULFATE (PF) 4 MG/ML IV SOLN
4.0000 mg | Freq: Once | INTRAVENOUS | Status: DC
  Filled 2019-11-04: qty 1

## 2019-11-04 NOTE — ED Notes (Signed)
Pt states he wishes to refuse care because he "cannot afford it".  Preston Fleeting, MD made aware
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
## 2021-09-21 DIAGNOSIS — I5042 Chronic combined systolic (congestive) and diastolic (congestive) heart failure: Secondary | ICD-10-CM

## 2021-09-21 HISTORY — DX: Chronic combined systolic (congestive) and diastolic (congestive) heart failure: I50.42

## 2021-10-14 ENCOUNTER — Other Ambulatory Visit: Payer: Self-pay

## 2021-10-14 ENCOUNTER — Ambulatory Visit (HOSPITAL_COMMUNITY): Payer: BC Managed Care – PPO

## 2021-10-14 ENCOUNTER — Ambulatory Visit (HOSPITAL_COMMUNITY)
Admission: EM | Admit: 2021-10-14 | Discharge: 2021-10-14 | Disposition: A | Discharge status: Home / Self Care | Payer: BC Managed Care – PPO | Attending: Family Medicine | Admitting: Family Medicine

## 2021-10-14 ENCOUNTER — Encounter (HOSPITAL_COMMUNITY): Payer: Self-pay

## 2021-10-14 ENCOUNTER — Ambulatory Visit (INDEPENDENT_AMBULATORY_CARE_PROVIDER_SITE_OTHER): Payer: BC Managed Care – PPO

## 2021-10-14 DIAGNOSIS — I10 Essential (primary) hypertension: Secondary | ICD-10-CM | POA: Diagnosis not present

## 2021-10-14 DIAGNOSIS — R0602 Shortness of breath: Secondary | ICD-10-CM | POA: Diagnosis not present

## 2021-10-14 DIAGNOSIS — J4521 Mild intermittent asthma with (acute) exacerbation: Secondary | ICD-10-CM | POA: Insufficient documentation

## 2021-10-14 LAB — COMPREHENSIVE METABOLIC PANEL
ALT: 46 U/L — ABNORMAL HIGH (ref 0–44)
AST: 34 U/L (ref 15–41)
Albumin: 3.5 g/dL (ref 3.5–5.0)
Alkaline Phosphatase: 33 U/L — ABNORMAL LOW (ref 38–126)
Anion gap: 8 (ref 5–15)
BUN: 8 mg/dL (ref 6–20)
CO2: 24 mmol/L (ref 22–32)
Calcium: 8.9 mg/dL (ref 8.9–10.3)
Chloride: 106 mmol/L (ref 98–111)
Creatinine, Ser: 0.81 mg/dL (ref 0.61–1.24)
GFR, Estimated: >60 mL/min (ref >60)
Glucose, Bld: 107 mg/dL — ABNORMAL HIGH (ref 70–99)
Potassium: 3.9 mmol/L (ref 3.5–5.1)
Sodium: 138 mmol/L (ref 135–145)
Total Bilirubin: 0.9 mg/dL (ref 0.3–1.2)
Total Protein: 6 g/dL — ABNORMAL LOW (ref 6.5–8.1)

## 2021-10-14 LAB — CBC WITH DIFFERENTIAL/PLATELET
Abs Immature Granulocytes: 0.01 10*3/uL (ref 0.00–0.07)
Basophils Absolute: 0 10*3/uL (ref 0.0–0.1)
Basophils Relative: 1 %
Eosinophils Absolute: 0.1 10*3/uL (ref 0.0–0.5)
Eosinophils Relative: 2 %
HCT: 47.4 % (ref 39.0–52.0)
Hemoglobin: 16.8 g/dL (ref 13.0–17.0)
Immature Granulocytes: 0 %
Lymphocytes Relative: 46 %
Lymphs Abs: 2.3 10*3/uL (ref 0.7–4.0)
MCH: 31.1 pg (ref 26.0–34.0)
MCHC: 35.4 g/dL (ref 30.0–36.0)
MCV: 87.8 fL (ref 80.0–100.0)
Monocytes Absolute: 0.5 10*3/uL (ref 0.1–1.0)
Monocytes Relative: 10 %
Neutro Abs: 2.1 10*3/uL (ref 1.7–7.7)
Neutrophils Relative %: 41 %
Platelets: 251 10*3/uL (ref 150–400)
RBC: 5.4 MIL/uL (ref 4.22–5.81)
RDW: 14.4 % (ref 11.5–15.5)
WBC: 5 10*3/uL (ref 4.0–10.5)
nRBC: 0 % (ref 0.0–0.2)

## 2021-10-14 LAB — BRAIN NATRIURETIC PEPTIDE: B Natriuretic Peptide: 503.4 pg/mL — ABNORMAL HIGH (ref 0.0–100.0)

## 2021-10-14 MED ORDER — FUROSEMIDE 20 MG PO TABS: 20.0000 mg | ORAL_TABLET | Freq: Every morning | ORAL | 0 refills | Status: DC

## 2021-10-14 MED ORDER — IPRATROPIUM-ALBUTEROL 0.5-2.5 (3) MG/3ML IN SOLN
3.0000 mL | Freq: Once | RESPIRATORY_TRACT | Status: AC
  Administered 2021-10-14: 14:00:00 3 mL via RESPIRATORY_TRACT

## 2021-10-14 MED ORDER — LISINOPRIL 10 MG PO TABS: 10.0000 mg | ORAL_TABLET | Freq: Every day | ORAL | 2 refills | Status: DC

## 2021-10-14 MED ORDER — ALBUTEROL SULFATE HFA 108 (90 BASE) MCG/ACT IN AERS: 1.0000 | INHALATION_SPRAY | Freq: Four times a day (QID) | RESPIRATORY_TRACT | 2 refills | Status: DC | PRN

## 2021-10-14 MED ORDER — PREDNISONE 20 MG PO TABS: 40.0000 mg | ORAL_TABLET | Freq: Every day | ORAL | 0 refills | Status: DC

## 2021-10-14 MED ORDER — ALBUTEROL SULFATE (2.5 MG/3ML) 0.083% IN NEBU
INHALATION_SOLUTION | RESPIRATORY_TRACT | Status: AC
  Filled 2021-10-14: qty 3

## 2021-10-14 NOTE — ED Triage Notes (Signed)
Pt presents with c/o difficulty breathing x 2-3 weeks.    States he does not have pain or discomfort. States he has not had an asthma attack in 30 years and states he does not use an inhaler.    Pt states he has been coughing up blood.   States he quit smoking Marijuana 2 weeks ago.

## 2021-10-14 NOTE — ED Triage Notes (Signed)
Pt states he works at [employer] center and states he works in a environment that is dry and cold and states he sweats in his uniform. 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    States he feels chest tightness and states he feels he is having an asthma attack.   States he breathes in a lot of dust particles at work.

## 2021-10-14 NOTE — Discharge Instructions (Addendum)
You have been seen at the [facility] today for feeling short of breath. Your ECG (heart tracing) did not show any worrisome changes. Your chest x-ray show that your heart was larger than normal and that you may have a little fluid on your lungs. Therefore, it's very important that you pay attention to any new symptoms or worsening of your current condition.  Please proceed directly to the Emergency Department immediately should you feel worse in any way.  Your blood pressure was noted to be elevated during your visit today. I am starting you on a blood pressure medication.  You have had labs (blood work) drawn today. We will call you with any significant abnormalities or if there is need to begin or change treatment or pursue further follow up.  You may also review your test results online through MyChart. If you do not have a MyChart account, instructions to sign up should be on your discharge paperwork.  BP (!) 175/141 (BP Location: Right Arm)    Pulse (S) 94 Comment: post NEB treatment   Temp 98.3 F (36.8 C) (Oral)    Resp (!) 36    SpO2 (S) 98% Comment: Post NEB treatment  BP Readings from Last 3 Encounters:  10/14/21 (!) 175/141  11/03/19 (!) 211/138  02/19/19 (!) 179/120

## 2021-10-15 NOTE — ED Provider Notes (Signed)
[facility] CARE CENTER   [id number] 10/14/21 Arrival Time: 1326  ASSESSMENT & PLAN:  1. SOB (shortness of breath)   2. Hypertension, uncontrolled   3. Mild intermittent asthma with acute exacerbation    Asthma/early CHF picture. BNP around 500. Reports breathing improvement after DuoNeb here. No indication for hospital admission at this time. He is comfortable with home observation. Agrees to ED eval should his symptoms worsen.  Labs Reviewed  COMPREHENSIVE METABOLIC PANEL - Abnormal; Notable for the following components:      Result Value   Glucose, Bld 107 (*)    Total Protein 6.0 (*)    ALT 46 (*)    Alkaline Phosphatase 33 (*)    All other components within normal limits  BRAIN NATRIURETIC PEPTIDE - Abnormal; Notable for the following components:   B Natriuretic Peptide 503.4 (*)    All other components within normal limits  CBC WITH DIFFERENTIAL/PLATELET   ECG: Performed today and interpreted by me: normal EKG, normal sinus rhythm. No acute changes.  I have personally viewed the imaging studies ordered this visit. Ques mild pulm edema.  Begin: Meds ordered this encounter  Medications   ipratropium-albuterol (DUONEB) 0.5-2.5 (3) MG/3ML nebulizer solution 3 mL   predniSONE (DELTASONE) 20 MG tablet    Sig: Take 2 tablets (40 mg total) by mouth daily.    Dispense:  10 tablet    Refill:  0   lisinopril (ZESTRIL) 10 MG tablet    Sig: Take 1 tablet (10 mg total) by mouth daily.    Dispense:  30 tablet    Refill:  2   albuterol (VENTOLIN HFA) 108 (90 Base) MCG/ACT inhaler    Sig: Inhale 1-2 puffs into the lungs every 6 (six) hours as needed for wheezing or shortness of breath.    Dispense:  1 each    Refill:  2   furosemide (LASIX) 20 MG tablet    Sig: Take 1 tablet (20 mg total) by mouth every morning.    Dispense:  30 tablet    Refill:  0   Referral Orders         Ambulatory referral to Cardiology      Follow-up Information     Schedule an appointment as soon  as possible for a visit  with Aliene Beams, MD.   Specialty: Family Medicine Contact information: [address] [place] [address] [phone]                Reviewed expectations re: course of current medical issues. Questions answered. Outlined signs and symptoms indicating need for more acute intervention. Patient verbalized understanding. After Visit Summary given.   SUBJECTIVE:  History from: patient. Mitchell Ochoa is a 41 y.o. male who presents with complaint of gradual onset of SOB; over past two weeks. H/O asthma and feels he is wheezing although no asthma problems for the past 20 years. Without CP. Does reports having to sleep propped up at night secondary to SOB. Feels weight is stable. No LE reported. Ambulatory. No tx PTA. Reports quitting smoking THC 2 weeks ago.  Increased blood pressure noted today. Reports that he has not been treated for hypertension in the past.  He reports no orthostatic dizziness or lightheadedness and no palpitations.  Social History   Tobacco Use  Smoking Status Every Day  Smokeless Tobacco Current   Social History   Substance and Sexual Activity  Alcohol Use Not Currently     OBJECTIVE:  Vitals:   10/14/21  1334 10/14/21 1336 10/14/21 1421  BP: (!) 175/141    Pulse: (!) 105  (S) 94  Resp: (!) 36    Temp:  98.3 F (36.8 C)   TempSrc:  Oral   SpO2: 95%  (S) 98%    RR improved after neb treatment: 24. HR down to 94. General appearance: mild resp distress initially Eyes: PERRLA; EOMI; conjunctivae normal HENT: normocephalic; atraumatic Neck: supple with FROM Lungs: with labored respirations; speaks full sentences without difficulty; CTAB Heart: regular rate and rhythm Chest Wall: without tenderness to palpation Abdomen: obese; without ascites; soft, non-tender; no guarding or rebound tenderness Extremities: without edema; without calf swelling or tenderness; symmetrical without gross deformities Skin:  warm and dry; without rash or lesions Neuro: normal gait Psychological: alert and cooperative; normal mood and affect  Labs: Results for orders placed or performed during the hospital encounter of 10/14/21  Comprehensive metabolic panel  Result Value Ref Range   Sodium 138 135 - 145 mmol/L   Potassium 3.9 3.5 - 5.1 mmol/L   Chloride 106 98 - 111 mmol/L   CO2 24 22 - 32 mmol/L   Glucose, Bld 107 (H) 70 - 99 mg/dL   BUN 8 6 - 20 mg/dL   Creatinine, Ser 2.68 0.61 - 1.24 mg/dL   Calcium 8.9 8.9 - 34.1 mg/dL   Total Protein 6.0 (L) 6.5 - 8.1 g/dL   Albumin 3.5 3.5 - 5.0 g/dL   AST 34 15 - 41 U/L   ALT 46 (H) 0 - 44 U/L   Alkaline Phosphatase 33 (L) 38 - 126 U/L   Total Bilirubin 0.9 0.3 - 1.2 mg/dL   GFR, Estimated >96 >22 mL/min   Anion gap 8 5 - 15  CBC with Differential/Platelet  Result Value Ref Range   WBC 5.0 4.0 - 10.5 K/uL   RBC 5.40 4.22 - 5.81 MIL/uL   Hemoglobin 16.8 13.0 - 17.0 g/dL   HCT 29.7 98.9 - 21.1 %   MCV 87.8 80.0 - 100.0 fL   MCH 31.1 26.0 - 34.0 pg   MCHC 35.4 30.0 - 36.0 g/dL   RDW 94.1 74.0 - 81.4 %   Platelets 251 150 - 400 K/uL   nRBC 0.0 0.0 - 0.2 %   Neutrophils Relative % 41 %   Neutro Abs 2.1 1.7 - 7.7 K/uL   Lymphocytes Relative 46 %   Lymphs Abs 2.3 0.7 - 4.0 K/uL   Monocytes Relative 10 %   Monocytes Absolute 0.5 0.1 - 1.0 K/uL   Eosinophils Relative 2 %   Eosinophils Absolute 0.1 0.0 - 0.5 K/uL   Basophils Relative 1 %   Basophils Absolute 0.0 0.0 - 0.1 K/uL   Immature Granulocytes 0 %   Abs Immature Granulocytes 0.01 0.00 - 0.07 K/uL  Brain natriuretic peptide  Result Value Ref Range   B Natriuretic Peptide 503.4 (H) 0.0 - 100.0 pg/mL    Imaging: DG Chest 2 View  Result Date: 10/14/2021 CLINICAL DATA:  Shortness of breath, cough. EXAM: CHEST - 2 VIEW COMPARISON:  March 05, 2008. FINDINGS: Mild cardiomegaly is noted with central pulmonary vascular congestion. Possible minimal bilateral pulmonary edema may be present. Both lungs are  clear. The visualized skeletal structures are unremarkable. IMPRESSION: Mild cardiomegaly with central pulmonary vascular congestion. Possible minimal bilateral pulmonary edema. Electronically Signed   By: Lupita Raider M.D.   On: 10/14/2021 14:58     No Known Allergies  History reviewed. No pertinent past medical history. Social  History   Socioeconomic History   Marital status: Single    Spouse name: Not on file   Number of children: Not on file   Years of education: Not on file   Highest education level: Not on file  Occupational History   Not on file  Tobacco Use   Smoking status: Every Day   Smokeless tobacco: Current  Substance and Sexual Activity   Alcohol use: Not Currently   Drug use: Yes    Types: Marijuana    Comment: everyday   Sexual activity: Not on file  Other Topics Concern   Not on file  Social History Narrative   Not on file   Social Determinants of Health   Financial Resource Strain: Not on file  Food Insecurity: Not on file  Transportation Needs: Not on file  Physical Activity: Not on file  Stress: Not on file  Social Connections: Not on file  Intimate Partner Violence: Not on file   History reviewed. No pertinent family history. History reviewed. No pertinent surgical history.    Mardella Layman, MD 10/15/21 [phone]

## 2021-10-16 ENCOUNTER — Encounter: Payer: Self-pay | Admitting: Pulmonary Disease

## 2021-10-16 ENCOUNTER — Other Ambulatory Visit: Payer: Self-pay

## 2021-10-16 ENCOUNTER — Ambulatory Visit (INDEPENDENT_AMBULATORY_CARE_PROVIDER_SITE_OTHER): Payer: BC Managed Care – PPO | Admitting: Pulmonary Disease

## 2021-10-16 VITALS — BP 162/100 | HR 100 | Temp 98.9°F | Ht 74.0 in | Wt 353.2 lb

## 2021-10-16 DIAGNOSIS — R042 Hemoptysis: Secondary | ICD-10-CM | POA: Diagnosis not present

## 2021-10-16 DIAGNOSIS — R0600 Dyspnea, unspecified: Secondary | ICD-10-CM | POA: Diagnosis not present

## 2021-10-16 MED ORDER — BUDESONIDE-FORMOTEROL FUMARATE 160-4.5 MCG/ACT IN AERO: 2.0000 | INHALATION_SPRAY | Freq: Two times a day (BID) | RESPIRATORY_TRACT | 5 refills | Status: DC

## 2021-10-16 NOTE — Progress Notes (Signed)
Mitchell Ochoa    [id number]    [birth year]  Primary Care Physician:Patient, No Pcp Per (Inactive)  Referring Physician: No referring provider defined for this encounter.  Chief complaint: Consult for dyspnea   HPI: 41 year old with uncontrolled hypertension, asthma Was evaluated at the ED on 1/24 with dyspnea, found to have CHF with elevated BNP cardiomegaly and pulmonary vascular congestion.  He was given duo nebs, prednisone.  Given albuterol inhaler, lisinopril and Lasix.  He has been referred to cardiology for further evaluation  He has history of childhood asthma but is not on controller medication. Complains of dyspnea on exertion with chest tightness, wheezing.  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  No feather pillows or comforters Smoking history: Smoked marijuana until early January 2023.  No tobacco use Travel history: Born in [place].  No significant recent travel Relevant family history: No family history of lung disease  Outpatient Encounter Medications as of 10/16/2021  Medication Sig   albuterol (VENTOLIN HFA) 108 (90 Base) MCG/ACT inhaler Inhale 1-2 puffs into the lungs every 6 (six) hours as needed for wheezing or shortness of breath.   furosemide (LASIX) 20 MG tablet Take 1 tablet (20 mg total) by mouth every morning.   lisinopril (ZESTRIL) 10 MG tablet Take 1 tablet (10 mg total) by mouth daily.   predniSONE (DELTASONE) 20 MG tablet Take 2 tablets (40 mg total) by mouth daily.   No facility-administered encounter medications on file as of 10/16/2021.    Allergies as of 10/16/2021   (No Known Allergies)    No past medical history on file.  No past surgical history on file.  No family history on file.  Social History   Socioeconomic History   Marital status: Single    Spouse name: Not on file   Number  of children: Not on file   Years of education: Not on file   Highest education level: Not on file  Occupational History   Not on file  Tobacco Use   Smoking status: Never   Smokeless tobacco: Never  Vaping Use   Vaping Use: Never used  Substance and Sexual Activity   Alcohol use: Not Currently   Drug use: Yes    Types: Marijuana    Comment: quit 09/29/21   Sexual activity: Not on file  Other Topics Concern   Not on file  Social History Narrative   Not on file   Social Determinants of Health   Financial Resource Strain: Not on file  Food Insecurity: Not on file  Transportation Needs: Not on file  Physical Activity: Not on file  Stress: Not on file  Social Connections: Not on file  Intimate Partner Violence: Not on file    Review of systems: Review of Systems  Constitutional: Negative for fever and chills.  HENT: Negative.   Eyes: Negative for blurred vision.  Respiratory: as per HPI  Cardiovascular: Negative for chest pain and palpitations.  Gastrointestinal: Negative for vomiting, diarrhea, blood per rectum. Genitourinary: Negative for dysuria, urgency, frequency and hematuria.  Musculoskeletal: Negative for myalgias, back pain and joint pain.  Skin: Negative for itching and rash.  Neurological: Negative for dizziness, tremors, focal weakness, seizures and loss of consciousness.  Endo/Heme/Allergies: Negative for environmental allergies.  Psychiatric/Behavioral: Negative for depression, suicidal ideas and hallucinations.  All other systems reviewed and  are negative.  Physical Exam: Blood pressure (!) 162/100, pulse 100, temperature 98.9 F (37.2 C), temperature source Oral, height 6\' 2"  (1.88 m), weight (!) 353 lb 3.2 oz (160.2 kg), SpO2 99 %. Gen:      No acute distress HEENT:  EOMI, sclera anicteric Neck:     No masses; no thyromegaly Lungs:    Clear to auscultation bilaterally; normal respiratory effort CV:         Regular rate and rhythm; no murmurs Abd:       + bowel sounds; soft, non-tender; no palpable masses, no distension Ext:    No edema; adequate peripheral perfusion Skin:      Warm and dry; no rash Neuro: alert and oriented x 3 Psych: normal mood and affect  Data Reviewed: Imaging: Chest x-ray 10/14/2021-cardiomegaly with mild vascular congestion, pulmonary edema I reviewed the images personally  PFTs:  Labs: CBC 10/14/2021-WBC 5, eos 2%, absolute eosinophil count 100  Assessment:  Consult for dyspnea History of asthma Suspect his presentation is mostly due to CHF, cardiomyopathy due to uncontrolled hypertension He is on Zestril and Lasix now.  Follow-up with cardiology is pending.    His recent CBC shows no significant eosinophilia in the blood.  Will evaluate with PFTs and continue albuterol as needed Start Symbicort 160 inhaler  Hemoptysis Reports intermittent blood in the sputum.  Get CT chest for further evaluation  Plan/Recommendations: Start Symbicort, check PFTs CT chest  Marshell Garfinkel MD [facility] 10/16/2021, 4:00 PM  CC: No ref. provider found

## 2021-10-16 NOTE — Patient Instructions (Signed)
He has already been referred to Dr. Jacinto Halim who will evaluate him further evaluation and management of heart failure For your lungs I will get a CT chest without contrast for evaluation of hemoptysis Schedule pulmonary function tests Start Symbicort 160 inhaler

## 2021-10-30 ENCOUNTER — Other Ambulatory Visit: Payer: Self-pay

## 2021-10-30 ENCOUNTER — Ambulatory Visit (INDEPENDENT_AMBULATORY_CARE_PROVIDER_SITE_OTHER)
Admission: RE | Admit: 2021-10-30 | Discharge: 2021-10-30 | Disposition: A | Discharge status: Home / Self Care | Payer: BC Managed Care – PPO | Source: Ambulatory Visit | Attending: Pulmonary Disease | Admitting: Pulmonary Disease

## 2021-10-30 DIAGNOSIS — R042 Hemoptysis: Secondary | ICD-10-CM

## 2021-11-06 ENCOUNTER — Other Ambulatory Visit: Payer: Self-pay | Admitting: *Deleted

## 2021-11-06 DIAGNOSIS — R042 Hemoptysis: Secondary | ICD-10-CM

## 2021-11-06 MED ORDER — AZITHROMYCIN 250 MG PO TABS: ORAL_TABLET | ORAL | 0 refills | Status: DC

## 2021-11-19 DIAGNOSIS — I428 Other cardiomyopathies: Secondary | ICD-10-CM

## 2021-11-19 HISTORY — DX: Other cardiomyopathies: I42.8

## 2021-11-28 ENCOUNTER — Emergency Department (HOSPITAL_COMMUNITY)
Admission: EM | Admit: 2021-11-28 | Discharge: 2021-11-28 | Disposition: A | Discharge status: Home / Self Care | Payer: BC Managed Care – PPO | Attending: Emergency Medicine | Admitting: Emergency Medicine

## 2021-11-28 ENCOUNTER — Encounter (HOSPITAL_COMMUNITY): Payer: Self-pay | Admitting: *Deleted

## 2021-11-28 ENCOUNTER — Emergency Department (HOSPITAL_COMMUNITY): Payer: BC Managed Care – PPO

## 2021-11-28 ENCOUNTER — Other Ambulatory Visit: Payer: Self-pay

## 2021-11-28 DIAGNOSIS — Z79899 Other long term (current) drug therapy: Secondary | ICD-10-CM | POA: Insufficient documentation

## 2021-11-28 DIAGNOSIS — R0602 Shortness of breath: Secondary | ICD-10-CM | POA: Diagnosis not present

## 2021-11-28 DIAGNOSIS — R000 Tachycardia, unspecified: Secondary | ICD-10-CM | POA: Insufficient documentation

## 2021-11-28 DIAGNOSIS — T50905A Adverse effect of unspecified drugs, medicaments and biological substances, initial encounter: Secondary | ICD-10-CM | POA: Insufficient documentation

## 2021-11-28 DIAGNOSIS — R609 Edema, unspecified: Secondary | ICD-10-CM | POA: Diagnosis not present

## 2021-11-28 DIAGNOSIS — T887XXA Unspecified adverse effect of drug or medicament, initial encounter: Secondary | ICD-10-CM | POA: Diagnosis not present

## 2021-11-28 DIAGNOSIS — R778 Other specified abnormalities of plasma proteins: Secondary | ICD-10-CM | POA: Insufficient documentation

## 2021-11-28 DIAGNOSIS — R0789 Other chest pain: Secondary | ICD-10-CM | POA: Diagnosis not present

## 2021-11-28 DIAGNOSIS — I10 Essential (primary) hypertension: Secondary | ICD-10-CM | POA: Insufficient documentation

## 2021-11-28 DIAGNOSIS — R002 Palpitations: Secondary | ICD-10-CM | POA: Insufficient documentation

## 2021-11-28 DIAGNOSIS — R079 Chest pain, unspecified: Secondary | ICD-10-CM

## 2021-11-28 HISTORY — DX: Essential (primary) hypertension: I10

## 2021-11-28 HISTORY — DX: Heart failure, unspecified: I50.9

## 2021-11-28 LAB — TROPONIN I (HIGH SENSITIVITY)
Troponin I (High Sensitivity): 60 ng/L — ABNORMAL HIGH (ref <18)
Troponin I (High Sensitivity): 55 ng/L — ABNORMAL HIGH (ref <18)

## 2021-11-28 LAB — BASIC METABOLIC PANEL
Anion gap: 10 (ref 5–15)
BUN: 8 mg/dL (ref 6–20)
CO2: 22 mmol/L (ref 22–32)
Calcium: 8.8 mg/dL — ABNORMAL LOW (ref 8.9–10.3)
Chloride: 107 mmol/L (ref 98–111)
Creatinine, Ser: 0.8 mg/dL (ref 0.61–1.24)
GFR, Estimated: >60 mL/min (ref >60)
Glucose, Bld: 128 mg/dL — ABNORMAL HIGH (ref 70–99)
Potassium: 4.1 mmol/L (ref 3.5–5.1)
Sodium: 139 mmol/L (ref 135–145)

## 2021-11-28 LAB — CBC
HCT: 49 % (ref 39.0–52.0)
Hemoglobin: 16.6 g/dL (ref 13.0–17.0)
MCH: 30.2 pg (ref 26.0–34.0)
MCHC: 33.9 g/dL (ref 30.0–36.0)
MCV: 89.3 fL (ref 80.0–100.0)
Platelets: 268 10*3/uL (ref 150–400)
RBC: 5.49 MIL/uL (ref 4.22–5.81)
RDW: 14.5 % (ref 11.5–15.5)
WBC: 4.6 10*3/uL (ref 4.0–10.5)
nRBC: 0.7 % — ABNORMAL HIGH (ref 0.0–0.2)

## 2021-11-28 LAB — PROTIME-INR
INR: 1.1 (ref 0.8–1.2)
Prothrombin Time: 14.5 seconds (ref 11.4–15.2)

## 2021-11-28 MED ORDER — LISINOPRIL 20 MG PO TABS
20.0000 mg | ORAL_TABLET | Freq: Once | ORAL | Status: AC
Start: 2021-11-28 — End: 2021-11-28
  Administered 2021-11-28: 20 mg via ORAL
  Filled 2021-11-28: qty 1

## 2021-11-28 NOTE — ED Triage Notes (Signed)
The pt is c/o chest pain for the past 45 minutes  some sob dizziness ?

## 2021-11-28 NOTE — ED Provider Notes (Signed)
[facility] EMERGENCY DEPARTMENT Provider Note   CSN: [id number] Arrival date & time: 11/28/21  1751     History  Chief Complaint  Patient presents with   Chest Pain    Mitchell Ochoa is a 41 y.o. male.  Patient is a 41 year old male with a history of longstanding hypertension that went years without being treated and newer diagnosis of CHF and cardiomegaly presenting today with complaints of shortness of breath, palpitations and some tightness in his chest.  Patient reports he saw his cardiologist yesterday and a PCP earlier this week.  He had been taking lisinopril 10 mg daily and furosemide 20 mg and when he saw his PCP on Wednesday they wanted him to change to an ACE/ARB and discontinue his lisinopril.  However he had not done that when he saw the cardiologist yesterday.  After his cardiologist visit yesterday they recommended increasing the lisinopril to 20 mg starting Toprol 50 mg and doing Lasix every other day.  Patient reports today he was getting ready to go to work which she works second shift and around 330 he took the dose of 50 mg of Toprol for the first time and shortly after he started having a knot in his stomach feeling short of breath like his heart was racing and that he might pass out.  The symptoms did not initially improve with sitting down and resting and he felt even more short of breath than normal and felt like he needed to come to the hospital because he was scared.  He reports for some time now he has been very winded with exertion and he was having some swelling in his lower extremities which is what prompted him to seek medical care in the first place.  At this moment he reports there is just some mild tightness in his chest but he is not feeling significantly short of breath at rest.  He has had some orthopnea at night.  He has not taken the lisinopril today yet but did have it yesterday.  He denies cough, congestion, fever, vomiting or  diarrhea.  The history is provided by the patient and medical records.  Chest Pain     Home Medications Prior to Admission medications   Medication Sig Start Date End Date Taking? Authorizing Provider  albuterol (VENTOLIN HFA) 108 (90 Base) MCG/ACT inhaler Inhale 1-2 puffs into the lungs every 6 (six) hours as needed for wheezing or shortness of breath. 10/14/21  Yes Hagler, Arlys John, MD  budesonide-formoterol (SYMBICORT) 160-4.5 MCG/ACT inhaler Inhale 2 puffs into the lungs in the morning and at bedtime. Patient taking differently: Inhale 2 puffs into the lungs 3 (three) times daily as needed (sob). 10/16/21  Yes Mannam, Praveen, MD  furosemide (LASIX) 20 MG tablet Take 1 tablet (20 mg total) by mouth every morning. 10/14/21  Yes Hagler, Arlys John, MD  metoprolol succinate (TOPROL-XL) 50 MG 24 hr tablet Take 50 mg by mouth daily. 11/27/21  Yes [provider]  Multiple Vitamin (MULTIVITAMIN ADULT) TABS Take 1 tablet by mouth in the morning.   Yes [provider]  OVER THE COUNTER MEDICATION Take 2 tablets by mouth at bedtime. Metabolism Control   Yes [provider]  azithromycin (ZITHROMAX) 250 MG tablet Take as directed Patient not taking: Reported on 11/28/2021 11/06/21   Chilton Greathouse, MD  lisinopril (ZESTRIL) 20 MG tablet Take 20 mg by mouth 2 (two) times daily. 11/27/21   [provider]  predniSONE (DELTASONE) 20 MG tablet Take  2 tablets (40 mg total) by mouth daily. Patient not taking: Reported on 11/28/2021 10/14/21   Mardella Layman, MD  telmisartan (MICARDIS) 40 MG tablet Take 40 mg by mouth daily. 11/26/21   [provider]      Allergies    Patient has no known allergies.    Review of Systems   Review of Systems  Cardiovascular:  Positive for chest pain.   Physical Exam Updated Vital Signs BP (!) 148/113   Pulse 92   Temp 98.4 F (36.9 C) (Oral)   Resp (!) 26   Ht 6\' 2"  (1.88 m)   Wt (!) 160.2 kg   SpO2 98%   BMI 45.35 kg/m  Physical  Exam Vitals and nursing note reviewed.  Constitutional:      General: He is not in acute distress.    Appearance: He is well-developed.  HENT:     Head: Normocephalic and atraumatic.     Mouth/Throat:     Mouth: Mucous membranes are moist.  Eyes:     Conjunctiva/sclera: Conjunctivae normal.     Pupils: Pupils are equal, round, and reactive to light.  Cardiovascular:     Rate and Rhythm: Regular rhythm. Tachycardia present.     Heart sounds: No murmur heard. Pulmonary:     Effort: Pulmonary effort is normal. No respiratory distress.     Breath sounds: Normal breath sounds. No wheezing or rales.  Abdominal:     General: There is no distension.     Palpations: Abdomen is soft.     Tenderness: There is no abdominal tenderness. There is no guarding or rebound.  Musculoskeletal:        General: No tenderness. Normal range of motion.     Cervical back: Normal range of motion and neck supple.     Comments: Trace edema in the ankles bilaterally  Skin:    General: Skin is warm and dry.     Findings: No erythema or rash.  Neurological:     Mental Status: He is alert and oriented to person, place, and time. Mental status is at baseline.  Psychiatric:        Mood and Affect: Mood normal.        Behavior: Behavior normal.    ED Results / Procedures / Treatments   Labs (all labs ordered are listed, but only abnormal results are displayed) Labs Reviewed  BASIC METABOLIC PANEL - Abnormal; Notable for the following components:      Result Value   Glucose, Bld 128 (*)    Calcium 8.8 (*)    All other components within normal limits  CBC - Abnormal; Notable for the following components:   nRBC 0.7 (*)    All other components within normal limits  TROPONIN I (HIGH SENSITIVITY) - Abnormal; Notable for the following components:   Troponin I (High Sensitivity) 60 (*)    All other components within normal limits  TROPONIN I (HIGH SENSITIVITY) - Abnormal; Notable for the following components:    Troponin I (High Sensitivity) 55 (*)    All other components within normal limits  PROTIME-INR    EKG EKG Interpretation  Date/Time:  Friday November 28 2021 17:56:05 EST Ventricular Rate:  105 PR Interval:  150 QRS Duration: 90 QT Interval:  350 QTC Calculation: 462 R Axis:   -50 Text Interpretation: Sinus tachycardia Biatrial enlargement Left axis deviation Cannot rule out Anterior infarct , age undetermined No significant change since last tracing When compared with ECG of 14-Oct-2021  16:19, PREVIOUS ECG IS PRESENT Confirmed by Gwyneth Sprout ([id number]) on 11/28/2021 6:27:55 PM  Radiology DG Chest 2 View  Result Date: 11/28/2021 CLINICAL DATA:  chest pain EXAM: CHEST - 2 VIEW COMPARISON:  Chest x-ray 10/14/2021, CT chest 10/30/2021 FINDINGS: Enlarged cardiac silhouette. The heart and mediastinal contours are unchanged. No focal consolidation. No pulmonary edema. No pleural effusion. No pneumothorax. No acute osseous abnormality. IMPRESSION: Persistent cardiomegaly with no definite active cardiopulmonary disease. Electronically Signed   By: Tish Frederickson M.D.   On: 11/28/2021 18:43    Procedures Procedures    Medications Ordered in ED Medications  lisinopril (ZESTRIL) tablet 20 mg (20 mg Oral Given 11/28/21 1938)    ED Course/ Medical Decision Making/ A&P                           Medical Decision Making Amount and/or Complexity of Data Reviewed External Data Reviewed: labs and notes. Labs: ordered. Decision-making details documented in ED Course. Radiology: ordered and independent interpretation performed. Decision-making details documented in ED Course. ECG/medicine tests: ordered and independent interpretation performed. Decision-making details documented in ED Course.  Risk Prescription drug management.   Patient is a 41 year old male presenting today with complaint of palpitations, shortness of breath and chest tightness that started after taking metoprolol for the  first time.  Here patient is calm and in no acute distress.  He is complaining of some minimal tightness in his chest at this time.  He has no symptoms suggestive of dissection today, PE or infectious process.  Concern for possible medication adverse reaction versus MI.  Patient has more chronic CHF and it does not sound like that has significantly worsened since his cardiology visit yesterday.  He is not checking his weight regularly but has been taking the Lasix.  Patient is noted to be hypertensive here and mildly tachycardic.  We will give him his dose of lisinopril which she has not had today.  We will ensure no evidence of cardiac injury with delta troponins as his symptoms started just an hour before he came.  I independently interpreted his labs and EKG.  EKG is unchanged today with nonspecific changes.  I independently interpreted and visualized his chest x-ray which shows cardiomegaly but no significant pleural effusions or pulmonary edema.  9:58 PM I independently interpreted patient's EKG and labs.  His labs today are still at baseline with a BMP that is unchanged and a CBC that is unchanged.  Patient's initial troponin was 60 and delta troponin was 55 which this is most likely has based outline with known cardiomyopathy.  He has never had a troponin in the past.  On repeat evaluation after lisinopril blood pressure has improved some but patient's symptoms are gone.  He is well-appearing and at this time does not meet criteria for admission.  He does have follow-up scheduled with cardiologist and is getting a echo soon.  Went over with him in detail which medications he should be on at this time.  Patient has no further questions and is stable for discharge.  No social determinants affecting his discharge today        Final Clinical Impression(s) / ED Diagnoses Final diagnoses:  Medication reaction, initial encounter  Nonspecific chest pain    Rx / DC Orders ED Discharge Orders      None         Gwyneth Sprout, MD 11/28/21 2202

## 2021-11-28 NOTE — ED Notes (Signed)
Pt verbalized understanding of d/c instructions, meds, and followup care. Denies questions. VSS, no distress noted. Steady gait to exit with all belongings.  ?

## 2021-11-28 NOTE — Discharge Instructions (Addendum)
You will continue to take the Lisinopril 20mg  1 time a day and the furosemide 20mg  every other day.  Do not take the Toprol because of the side effects you experienced today.  Also do not fill the Telmisartan (new blood pressure medication at the pharmacy) at this time.  Just keep doing the above medications until the cardiologist tells you otherwise.  Try to continue with a low salt diet and avoid eating fast food or boxed meals.   ?It was a pleasure to take care of you today. ?

## 2021-12-20 DIAGNOSIS — I2510 Atherosclerotic heart disease of native coronary artery without angina pectoris: Secondary | ICD-10-CM

## 2021-12-20 HISTORY — DX: Atherosclerotic heart disease of native coronary artery without angina pectoris: I25.10

## 2021-12-23 ENCOUNTER — Telehealth: Payer: Self-pay | Admitting: Pulmonary Disease

## 2021-12-23 NOTE — Telephone Encounter (Signed)
Called and spoke to patient about upcoming ct scan on 4/20 nothing further needed.  ?

## 2021-12-23 NOTE — Telephone Encounter (Signed)
I tried to call him and left a VM with my name. I will return his call. Nothing further needed   ?

## 2022-01-01 HISTORY — PX: LEFT HEART CATH AND CORONARY ANGIOGRAPHY: CATH118249

## 2022-01-02 DIAGNOSIS — I428 Other cardiomyopathies: Secondary | ICD-10-CM | POA: Insufficient documentation

## 2022-01-08 ENCOUNTER — Ambulatory Visit (INDEPENDENT_AMBULATORY_CARE_PROVIDER_SITE_OTHER)
Admission: RE | Admit: 2022-01-08 | Discharge: 2022-01-08 | Disposition: A | Discharge status: Home / Self Care | Payer: BC Managed Care – PPO | Source: Ambulatory Visit | Attending: Pulmonary Disease | Admitting: Pulmonary Disease

## 2022-01-08 DIAGNOSIS — R042 Hemoptysis: Secondary | ICD-10-CM

## 2022-01-15 ENCOUNTER — Ambulatory Visit: Payer: Self-pay | Admitting: Pulmonary Disease

## 2022-01-20 ENCOUNTER — Encounter: Payer: Self-pay | Admitting: Pulmonary Disease

## 2022-01-20 ENCOUNTER — Ambulatory Visit (INDEPENDENT_AMBULATORY_CARE_PROVIDER_SITE_OTHER): Payer: BC Managed Care – PPO | Admitting: Pulmonary Disease

## 2022-01-20 VITALS — BP 138/64 | HR 94 | Temp 97.7°F | Ht 74.0 in | Wt 348.4 lb

## 2022-01-20 DIAGNOSIS — R911 Solitary pulmonary nodule: Secondary | ICD-10-CM | POA: Diagnosis not present

## 2022-01-20 DIAGNOSIS — R0600 Dyspnea, unspecified: Secondary | ICD-10-CM | POA: Diagnosis not present

## 2022-01-20 MED ORDER — LEVOFLOXACIN 750 MG PO TABS: 750.0000 mg | ORAL_TABLET | Freq: Every day | ORAL | 0 refills | Status: DC

## 2022-01-20 NOTE — Patient Instructions (Signed)
We will check sputum for regular cultures, AFB and fungus ?Start levofloxacin 750 mg a day for 14 days ?Check labs today including CBC with differential, IgE, ANA, rheumatoid factor, CCP, ANCA, hypersensitivity panel and angiotensin-converting enzyme, SSA, SSB and SCL 70 ? ?Order follow-up CT chest without contrast in 3 months and return to clinic after CT scan ?

## 2022-01-20 NOTE — Progress Notes (Signed)
? ?      ?Mitchell Ochoa    [id number]    Jun 30, 1981 ? ?Primary Care Physician:Jackson, Rondel Oh, PA-C ? ?Referring Physician: Barnie Mort, PA-C ?[phone] ?[address] ?[place],  [place] [address] ? ?Chief complaint: Follow-up for dyspnea ? ?HPI: ?41 year old with uncontrolled hypertension, asthma ?Was evaluated at the ED on 1/24 with dyspnea, found to have CHF with elevated BNP cardiomegaly and pulmonary vascular congestion.  He was given duo nebs, prednisone.  Given albuterol inhaler, lisinopril and Lasix.  He has been referred to cardiology for further evaluation ? ?He has history of childhood asthma but is not on controller medication. ?Complains of dyspnea on exertion with chest tightness, wheezing.  Also reports that he has cough with occasional blood in the mucus ? 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  No feather pillows or comforters ?Smoking history: Smoked marijuana until early January 2023.  No tobacco use ?Travel history: Born in [place].  No significant recent travel ?Relevant family history: No family history of lung disease ? ?Interim history: ?Here for review of CT scan ?States that he is developed cough over the past few weeks with chest congestion and mucus ? ?Continues to have dyspnea on exertion ?He is working with his cardiologist at [facility] to get his blood pressure under better control ? ?Outpatient Encounter Medications as of 01/20/2022  ?Medication Sig  ? albuterol (VENTOLIN HFA) 108 (90 Base) MCG/ACT inhaler Inhale 1-2 puffs into the lungs every 6 (six) hours as needed for wheezing or shortness of breath.  ? budesonide-formoterol (SYMBICORT) 160-4.5 MCG/ACT inhaler Inhale 2 puffs into the lungs in the morning and at bedtime. (Patient taking differently: Inhale 2 puffs into the lungs 3 (three) times daily as needed (sob).)  ? furosemide (LASIX) 20 MG tablet Take 1 tablet (20 mg total) by mouth every  morning.  ? lisinopril (ZESTRIL) 20 MG tablet Take 20 mg by mouth 2 (two) times daily.  ? Multiple Vitamin (MULTIVITAMIN ADULT) TABS Take 1 tablet by mouth in the morning.  ? OVER THE COUNTER MEDICATION Take 2 tablets by mouth at bedtime. Metabolism Control  ? telmisartan (MICARDIS) 40 MG tablet Take 40 mg by mouth daily.  ? [DISCONTINUED] azithromycin (ZITHROMAX) 250 MG tablet Take as directed (Patient not taking: Reported on 11/28/2021)  ? [DISCONTINUED] metoprolol succinate (TOPROL-XL) 50 MG 24 hr tablet Take 50 mg by mouth daily.  ? [DISCONTINUED] predniSONE (DELTASONE) 20 MG tablet Take 2 tablets (40 mg total) by mouth daily. (Patient not taking: Reported on 11/28/2021)  ? ?No facility-administered encounter medications on file as of 01/20/2022.  ? ? ?Physical Exam: ?Blood pressure 138/64, pulse 94, temperature 97.7 ?F (36.5 ?C), temperature source Oral, height 6\' 2"  (1.88 m), weight (!) 348 lb 6.4 oz (158 kg), SpO2 100 %. ?Gen:      No acute distress ?HEENT:  EOMI, sclera anicteric ?Neck:     No masses; no thyromegaly ?Lungs:    Clear to auscultation bilaterally; normal respiratory effort ?CV:         Regular rate and rhythm; no murmurs ?Abd:      + bowel sounds; soft, non-tender; no palpable masses, no distension ?Ext:    No edema; adequate peripheral perfusion ?Skin:      Warm and dry; no rash ?Neuro: alert and oriented x 3 ?Psych: normal mood and affect  ? ?Data Reviewed: ?Imaging: ?Chest x-ray 10/14/2021-cardiomegaly with mild vascular congestion, pulmonary edema ?  CT 10/30/2021 bronchial wall thickening with subsegmental atelectasis.  Mild increase in lymph nodes ?CT 01/08/2022-interval development of centrilobular groundglass nodules, stable mild lymphadenopathy. ?I reviewed the images personally ? ?PFTs: ? ?Labs: ?CBC 10/14/2021-WBC 5, eos 2%, absolute eosinophil count 100 ? ?Assessment:  ?Consult for dyspnea ?History of asthma ?Suspect his presentation is mostly due to CHF, cardiomyopathy due to uncontrolled  hypertension ?He is on Zestril and Lasix now.  Follow-up with cardiology is pending.   ? ?His recent CBC shows no significant eosinophilia in the blood.  Will evaluate with PFTs and continue albuterol as needed ?Continue Symbicort inhaler ? ?Abnormal CT ?CT shows interval development of groundglass nodules bilaterally concerning for inflammation/infection.  Since he is having cough with mucus production we will check sputum cultures, treat with Levaquin ? ?We will also check for noninfectious etiologies with CTD serologies, angiotensin-converting enzyme.  Get follow-up CT in 3 months.  If abnormalities persist then he may need a bronchoscopy.  We discussed all this in detail today ? ?Plan/Recommendations: ?Sputum cultures ?Levofloxacin ?Labs including CTD serologies ? ?Chilton Greathouse MD ?[facility] ?01/20/2022, 4:07 PM ? ?CC: Barnie Mort, PA-C ? ?  ?

## 2022-01-21 ENCOUNTER — Other Ambulatory Visit (INDEPENDENT_AMBULATORY_CARE_PROVIDER_SITE_OTHER): Payer: BC Managed Care – PPO

## 2022-01-21 DIAGNOSIS — R0600 Dyspnea, unspecified: Secondary | ICD-10-CM

## 2022-01-21 LAB — CBC WITH DIFFERENTIAL/PLATELET
Basophils Absolute: 0 10*3/uL (ref 0.0–0.1)
Basophils Relative: 0.7 % (ref 0.0–3.0)
Eosinophils Absolute: 0.1 10*3/uL (ref 0.0–0.7)
Eosinophils Relative: 1.8 % (ref 0.0–5.0)
HCT: 51.2 % (ref 39.0–52.0)
Hemoglobin: 16.6 g/dL (ref 13.0–17.0)
Lymphocytes Relative: 53.5 % — ABNORMAL HIGH (ref 12.0–46.0)
Lymphs Abs: 2.3 10*3/uL (ref 0.7–4.0)
MCHC: 32.3 g/dL (ref 30.0–36.0)
MCV: 93.1 fl (ref 78.0–100.0)
Monocytes Absolute: 0.4 10*3/uL (ref 0.1–1.0)
Monocytes Relative: 10.2 % (ref 3.0–12.0)
Neutro Abs: 1.5 10*3/uL (ref 1.4–7.7)
Neutrophils Relative %: 33.8 % — ABNORMAL LOW (ref 43.0–77.0)
Platelets: 203 10*3/uL (ref 150.0–400.0)
RBC: 5.5 Mil/uL (ref 4.22–5.81)
RDW: 15.4 % (ref 11.5–15.5)
WBC: 4.3 10*3/uL (ref 4.0–10.5)

## 2022-01-22 LAB — RESPIRATORY CULTURE OR RESPIRATORY AND SPUTUM CULTURE: MICRO NUMBER:: 13345990

## 2022-01-27 LAB — ANTI-NUCLEAR AB-TITER (ANA TITER): ANA Titer 1: 1:40 {titer} — ABNORMAL HIGH

## 2022-01-27 LAB — IGE: IgE (Immunoglobulin E), Serum: 365 kU/L — ABNORMAL HIGH (ref <=114)

## 2022-01-27 LAB — SJOGREN'S SYNDROME ANTIBODS(SSA + SSB)
SSA (Ro) (ENA) Antibody, IgG: <1 AI
SSB (La) (ENA) Antibody, IgG: <1 AI

## 2022-01-27 LAB — ANTI-SCLERODERMA ANTIBODY: Scleroderma (Scl-70) (ENA) Antibody, IgG: <1 AI

## 2022-01-27 LAB — ANCA SCREEN W REFLEX TITER: ANCA SCREEN: NEGATIVE

## 2022-01-27 LAB — RHEUMATOID FACTOR: Rheumatoid fact SerPl-aCnc: <14 IU/mL (ref <14)

## 2022-01-27 LAB — CYCLIC CITRUL PEPTIDE ANTIBODY, IGG: Cyclic Citrullin Peptide Ab: <16 UNITS

## 2022-01-27 LAB — ANGIOTENSIN CONVERTING ENZYME: Angiotensin-Converting Enzyme: 13 U/L (ref 9–67)

## 2022-01-27 LAB — ANA: Anti Nuclear Antibody (ANA): POSITIVE — AB

## 2022-01-30 LAB — HYPERSENSITIVITY PNEUMONITIS
A. Pullulans Abs: NEGATIVE
A.Fumigatus #1 Abs: NEGATIVE
Micropolyspora faeni, IgG: NEGATIVE
Pigeon Serum Abs: NEGATIVE
Thermoact. Saccharii: NEGATIVE
Thermoactinomyces vulgaris, IgG: NEGATIVE

## 2022-02-03 ENCOUNTER — Other Ambulatory Visit: Payer: Self-pay

## 2022-02-03 ENCOUNTER — Emergency Department (HOSPITAL_COMMUNITY): Payer: BC Managed Care – PPO

## 2022-02-03 ENCOUNTER — Inpatient Hospital Stay (HOSPITAL_COMMUNITY)
Admission: EM | Admit: 2022-02-03 | Discharge: 2022-02-07 | DRG: 291 | Disposition: A | Discharge status: Home / Self Care | Payer: BC Managed Care – PPO | Attending: Infectious Diseases | Admitting: Infectious Diseases

## 2022-02-03 ENCOUNTER — Encounter (HOSPITAL_COMMUNITY): Payer: Self-pay | Admitting: Emergency Medicine

## 2022-02-03 ENCOUNTER — Other Ambulatory Visit: Payer: BC Managed Care – PPO

## 2022-02-03 DIAGNOSIS — Z79899 Other long term (current) drug therapy: Secondary | ICD-10-CM

## 2022-02-03 DIAGNOSIS — I10 Essential (primary) hypertension: Secondary | ICD-10-CM

## 2022-02-03 DIAGNOSIS — I472 Ventricular tachycardia, unspecified: Secondary | ICD-10-CM | POA: Diagnosis not present

## 2022-02-03 DIAGNOSIS — I5043 Acute on chronic combined systolic (congestive) and diastolic (congestive) heart failure: Secondary | ICD-10-CM | POA: Diagnosis present

## 2022-02-03 DIAGNOSIS — Z888 Allergy status to other drugs, medicaments and biological substances status: Secondary | ICD-10-CM

## 2022-02-03 DIAGNOSIS — Z8249 Family history of ischemic heart disease and other diseases of the circulatory system: Secondary | ICD-10-CM

## 2022-02-03 DIAGNOSIS — I5023 Acute on chronic systolic (congestive) heart failure: Secondary | ICD-10-CM | POA: Diagnosis not present

## 2022-02-03 DIAGNOSIS — Z87891 Personal history of nicotine dependence: Secondary | ICD-10-CM | POA: Diagnosis not present

## 2022-02-03 DIAGNOSIS — I509 Heart failure, unspecified: Principal | ICD-10-CM

## 2022-02-03 DIAGNOSIS — R0902 Hypoxemia: Secondary | ICD-10-CM | POA: Diagnosis present

## 2022-02-03 DIAGNOSIS — R059 Cough, unspecified: Secondary | ICD-10-CM | POA: Diagnosis present

## 2022-02-03 DIAGNOSIS — Z91119 Patient's noncompliance with dietary regimen due to unspecified reason: Secondary | ICD-10-CM

## 2022-02-03 DIAGNOSIS — I428 Other cardiomyopathies: Secondary | ICD-10-CM | POA: Diagnosis present

## 2022-02-03 DIAGNOSIS — Z6841 Body Mass Index (BMI) 40.0 and over, adult: Secondary | ICD-10-CM

## 2022-02-03 DIAGNOSIS — T501X6A Underdosing of loop [high-ceiling] diuretics, initial encounter: Secondary | ICD-10-CM | POA: Diagnosis present

## 2022-02-03 DIAGNOSIS — I4729 Other ventricular tachycardia: Secondary | ICD-10-CM | POA: Diagnosis not present

## 2022-02-03 DIAGNOSIS — I110 Hypertensive heart disease with heart failure: Secondary | ICD-10-CM | POA: Diagnosis present

## 2022-02-03 DIAGNOSIS — I2510 Atherosclerotic heart disease of native coronary artery without angina pectoris: Secondary | ICD-10-CM | POA: Diagnosis present

## 2022-02-03 DIAGNOSIS — Z91148 Patient's other noncompliance with medication regimen for other reason: Secondary | ICD-10-CM | POA: Diagnosis not present

## 2022-02-03 DIAGNOSIS — I5042 Chronic combined systolic (congestive) and diastolic (congestive) heart failure: Secondary | ICD-10-CM

## 2022-02-03 DIAGNOSIS — Z7951 Long term (current) use of inhaled steroids: Secondary | ICD-10-CM

## 2022-02-03 DIAGNOSIS — I5020 Unspecified systolic (congestive) heart failure: Secondary | ICD-10-CM | POA: Diagnosis not present

## 2022-02-03 LAB — COMPREHENSIVE METABOLIC PANEL
ALT: 45 U/L — ABNORMAL HIGH (ref 0–44)
ALT: 43 U/L (ref 0–44)
AST: 38 U/L (ref 15–41)
AST: 36 U/L (ref 15–41)
Albumin: 3.3 g/dL — ABNORMAL LOW (ref 3.5–5.0)
Albumin: 3.1 g/dL — ABNORMAL LOW (ref 3.5–5.0)
Alkaline Phosphatase: 40 U/L (ref 38–126)
Alkaline Phosphatase: 34 U/L — ABNORMAL LOW (ref 38–126)
Anion gap: 9 (ref 5–15)
Anion gap: 10 (ref 5–15)
BUN: 10 mg/dL (ref 6–20)
BUN: 9 mg/dL (ref 6–20)
CO2: 23 mmol/L (ref 22–32)
CO2: 28 mmol/L (ref 22–32)
Calcium: 8.6 mg/dL — ABNORMAL LOW (ref 8.9–10.3)
Calcium: 8.6 mg/dL — ABNORMAL LOW (ref 8.9–10.3)
Chloride: 109 mmol/L (ref 98–111)
Chloride: 109 mmol/L (ref 98–111)
Creatinine, Ser: 0.94 mg/dL (ref 0.61–1.24)
Creatinine, Ser: 0.9 mg/dL (ref 0.61–1.24)
GFR, Estimated: >60 mL/min (ref >60)
GFR, Estimated: >60 mL/min (ref >60)
Glucose, Bld: 117 mg/dL — ABNORMAL HIGH (ref 70–99)
Glucose, Bld: 113 mg/dL — ABNORMAL HIGH (ref 70–99)
Potassium: 3.8 mmol/L (ref 3.5–5.1)
Potassium: 3.4 mmol/L — ABNORMAL LOW (ref 3.5–5.1)
Sodium: 141 mmol/L (ref 135–145)
Sodium: 147 mmol/L — ABNORMAL HIGH (ref 135–145)
Total Bilirubin: 3.1 mg/dL — ABNORMAL HIGH (ref 0.3–1.2)
Total Bilirubin: 3.1 mg/dL — ABNORMAL HIGH (ref 0.3–1.2)
Total Protein: 5.5 g/dL — ABNORMAL LOW (ref 6.5–8.1)
Total Protein: 5.1 g/dL — ABNORMAL LOW (ref 6.5–8.1)

## 2022-02-03 LAB — CBC WITH DIFFERENTIAL/PLATELET
Abs Immature Granulocytes: 0.01 10*3/uL (ref 0.00–0.07)
Basophils Absolute: 0 10*3/uL (ref 0.0–0.1)
Basophils Relative: 1 %
Eosinophils Absolute: 0.1 10*3/uL (ref 0.0–0.5)
Eosinophils Relative: 2 %
HCT: 49.2 % (ref 39.0–52.0)
Hemoglobin: 17 g/dL (ref 13.0–17.0)
Immature Granulocytes: 0 %
Lymphocytes Relative: 47 %
Lymphs Abs: 2.2 10*3/uL (ref 0.7–4.0)
MCH: 30.9 pg (ref 26.0–34.0)
MCHC: 34.6 g/dL (ref 30.0–36.0)
MCV: 89.3 fL (ref 80.0–100.0)
Monocytes Absolute: 0.4 10*3/uL (ref 0.1–1.0)
Monocytes Relative: 9 %
Neutro Abs: 1.9 10*3/uL (ref 1.7–7.7)
Neutrophils Relative %: 41 %
Platelets: 223 10*3/uL (ref 150–400)
RBC: 5.51 MIL/uL (ref 4.22–5.81)
RDW: 15.1 % (ref 11.5–15.5)
WBC: 4.7 10*3/uL (ref 4.0–10.5)
nRBC: 0.4 % — ABNORMAL HIGH (ref 0.0–0.2)

## 2022-02-03 LAB — HIV ANTIBODY (ROUTINE TESTING W REFLEX): HIV Screen 4th Generation wRfx: NONREACTIVE

## 2022-02-03 LAB — LIPASE, BLOOD: Lipase: 24 U/L (ref 11–51)

## 2022-02-03 LAB — BRAIN NATRIURETIC PEPTIDE: B Natriuretic Peptide: 859.3 pg/mL — ABNORMAL HIGH (ref 0.0–100.0)

## 2022-02-03 LAB — TROPONIN I (HIGH SENSITIVITY)
Troponin I (High Sensitivity): 31 ng/L — ABNORMAL HIGH (ref <18)
Troponin I (High Sensitivity): 33 ng/L — ABNORMAL HIGH (ref <18)

## 2022-02-03 LAB — MAGNESIUM: Magnesium: 1.6 mg/dL — ABNORMAL LOW (ref 1.7–2.4)

## 2022-02-03 MED ORDER — POTASSIUM CHLORIDE CRYS ER 20 MEQ PO TBCR
30.0000 meq | EXTENDED_RELEASE_TABLET | Freq: Four times a day (QID) | ORAL | Status: AC
  Administered 2022-02-03 (×2): 30 meq via ORAL
  Filled 2022-02-03 (×2): qty 1

## 2022-02-03 MED ORDER — FUROSEMIDE 10 MG/ML IJ SOLN
40.0000 mg | Freq: Two times a day (BID) | INTRAMUSCULAR | Status: DC
  Administered 2022-02-03 – 2022-02-06 (×6): 40 mg via INTRAVENOUS
  Filled 2022-02-03 (×6): qty 4

## 2022-02-03 MED ORDER — SODIUM CHLORIDE 0.9 % IV SOLN
250.0000 mL | INTRAVENOUS | Status: DC | PRN
  Administered 2022-02-03: 250 mL via INTRAVENOUS

## 2022-02-03 MED ORDER — IOHEXOL 350 MG/ML SOLN
75.0000 mL | Freq: Once | INTRAVENOUS | Status: AC | PRN
Start: 2022-02-03 — End: 2022-02-03
  Administered 2022-02-03: 75 mL via INTRAVENOUS

## 2022-02-03 MED ORDER — SPIRONOLACTONE 12.5 MG HALF TABLET
12.5000 mg | ORAL_TABLET | Freq: Every day | ORAL | Status: DC
  Administered 2022-02-03 – 2022-02-07 (×5): 12.5 mg via ORAL
  Filled 2022-02-03 (×5): qty 1

## 2022-02-03 MED ORDER — SACUBITRIL-VALSARTAN 24-26 MG PO TABS
1.0000 | ORAL_TABLET | Freq: Two times a day (BID) | ORAL | Status: DC
  Administered 2022-02-05 – 2022-02-07 (×5): 1 via ORAL
  Filled 2022-02-03 (×5): qty 1

## 2022-02-03 MED ORDER — MAGNESIUM SULFATE 2 GM/50ML IV SOLN
2.0000 g | Freq: Once | INTRAVENOUS | Status: AC
  Administered 2022-02-03: 2 g via INTRAVENOUS
  Filled 2022-02-03: qty 50

## 2022-02-03 MED ORDER — LISINOPRIL 20 MG PO TABS
20.0000 mg | ORAL_TABLET | Freq: Two times a day (BID) | ORAL | Status: DC
  Administered 2022-02-03: 20 mg via ORAL
  Filled 2022-02-03: qty 1

## 2022-02-03 MED ORDER — HYDRALAZINE HCL 25 MG PO TABS
25.0000 mg | ORAL_TABLET | Freq: Three times a day (TID) | ORAL | Status: DC
  Administered 2022-02-03 – 2022-02-05 (×6): 25 mg via ORAL
  Filled 2022-02-03 (×6): qty 1

## 2022-02-03 MED ORDER — ONDANSETRON HCL 4 MG/2ML IJ SOLN: 4.0000 mg | Freq: Four times a day (QID) | INTRAMUSCULAR | Status: DC | PRN

## 2022-02-03 MED ORDER — ACETAMINOPHEN 325 MG PO TABS: 650.0000 mg | ORAL_TABLET | ORAL | Status: DC | PRN

## 2022-02-03 MED ORDER — RIVAROXABAN 10 MG PO TABS
10.0000 mg | ORAL_TABLET | Freq: Every day | ORAL | Status: DC
  Administered 2022-02-04 – 2022-02-07 (×4): 10 mg via ORAL
  Filled 2022-02-03 (×5): qty 1

## 2022-02-03 MED ORDER — SODIUM CHLORIDE 0.9% FLUSH: 3.0000 mL | INTRAVENOUS | Status: DC | PRN

## 2022-02-03 MED ORDER — RIVAROXABAN 10 MG PO TABS
10.0000 mg | ORAL_TABLET | Freq: Every day | ORAL | Status: DC
  Administered 2022-02-03: 10 mg via ORAL
  Filled 2022-02-03: qty 1

## 2022-02-03 MED ORDER — FUROSEMIDE 10 MG/ML IJ SOLN
40.0000 mg | Freq: Once | INTRAMUSCULAR | Status: AC
  Administered 2022-02-03: 40 mg via INTRAVENOUS
  Filled 2022-02-03: qty 4

## 2022-02-03 MED ORDER — DAPAGLIFLOZIN PROPANEDIOL 10 MG PO TABS
10.0000 mg | ORAL_TABLET | Freq: Every day | ORAL | Status: DC
  Administered 2022-02-03 – 2022-02-07 (×5): 10 mg via ORAL
  Filled 2022-02-03 (×5): qty 1

## 2022-02-03 MED ORDER — CARVEDILOL 12.5 MG PO TABS
12.5000 mg | ORAL_TABLET | Freq: Two times a day (BID) | ORAL | Status: DC
  Administered 2022-02-03 – 2022-02-07 (×9): 12.5 mg via ORAL
  Filled 2022-02-03 (×9): qty 1

## 2022-02-03 MED ORDER — SODIUM CHLORIDE 0.9% FLUSH
3.0000 mL | Freq: Two times a day (BID) | INTRAVENOUS | Status: DC
  Administered 2022-02-03 – 2022-02-07 (×8): 3 mL via INTRAVENOUS

## 2022-02-03 NOTE — ED Provider Triage Note (Addendum)
Emergency Medicine Provider Triage Evaluation Note ? ?Mitchell Ochoa , a 41 y.o. male  was evaluated in triage.  Pt complains of vomiting up blood after eating vegetable lo mein tonight.  States initially was pink but has become gradually more red.  Denies throwing up any clots.  Reports ongoing shortness of breath.  Recently diagnosed with CHF at [facility], echocardiogram with a EF of 20 to 25%.  He has been started on several medications but reports swelling in the legs seems worse now. ? ?Review of Systems  ?Positive: SOB, vomiting ?Negative: fever ? ?Physical Exam  ?BP (!) 148/119   Pulse (!) 106   Temp 98.6 ?F (37 ?C) (Oral)   Resp 20   SpO2 92%  ? ?Gen:   Awake, no distress   ?Resp:  Normal effort  ?MSK:   Moves extremities without difficulty  ?Other:  2+ BLE edema ? ?Medical Decision Making  ?Medically screening exam initiated at 3:54 AM.  Appropriate orders placed.  Mitchell Ochoa was informed that the remainder of the evaluation will be completed by another provider, this initial triage assessment does not replace that evaluation, and the importance of remaining in the ED until their evaluation is complete. ? ?Vomiting blood, shortness of breath.  Newly diagnosed with CHF, EF 20 to 25%.  Does have some pitting edema on exam.  He is hypertensive and tachycardic in triage.  O2 sats marginal at 92%.  Will obtain EKG, labs, chest x-ray. ? ?6:11 AM ?Notified by registration that patient had left.  He had elevated trop and BNP.  Called patient and recommended that he return, he states he would try to come back this morning. ?  ?Garlon Hatchet, PA-C ?02/03/22 0404 ? ?  ?Garlon Hatchet, PA-C ?02/03/22 [phone] ? ?

## 2022-02-03 NOTE — ED Provider Notes (Signed)
?[facility] EMERGENCY DEPARTMENT ?Provider Note ? ? ?CSN: [id number] ?Arrival date & time: 02/03/22  0340 ? ?  ? ?History ? ?Chief Complaint  ?Patient presents with  ? Shortness of Breath  ? ? ?Mitchell Ochoa is a 41 y.o. male. ? ? ?Shortness of Breath ?Associated symptoms: cough and vomiting   ?Associated symptoms: no abdominal pain, no chest pain, no fever and no wheezing   ? ?41 year old male with longstanding history of shortness of breath. He reports continued shortness of breath as well as a few episodes of vomiting since he ate [employer]in last night.  He states that he has experienced food aversion since. He has tolerated liquids without difficulty.  He said that he has been coughing up pink frothy sputum for a while, but his visit to hospital this time was prompted by 1 episode of producing blood about "the size of a thimble".  Patient states that he has not been taking his heart failure medication as prescribed by his cardiologist secondary to being concerned about potential side effects of medication.  He says that the medications that he has tried taking intermittently have not been very helpful.  He has a known ejection fraction of 20 to 25% as of 12/15/2021 with systolic congestive heart failure.  Patient states that he does not want to be on medication, but also does not want to feel like this.  He denies fever, chills, night sweats, chest pain, abdominal pain, nausea, diarrhea, urinary symptoms or change in bowel habits. ? ?Past medical history significant for systolic heart failure, cardiomyopathy, hypertension, pulmonary lung nodules. ? ?Home Medications ?Prior to Admission medications   ?Medication Sig Start Date End Date Taking? Authorizing Provider  ?albuterol (VENTOLIN HFA) 108 (90 Base) MCG/ACT inhaler Inhale 1-2 puffs into the lungs every 6 (six) hours as needed for wheezing or shortness of breath. 10/14/21  Yes Hagler, Arlys John, MD  ?budesonide-formoterol (SYMBICORT) 160-4.5  MCG/ACT inhaler Inhale 2 puffs into the lungs in the morning and at bedtime. ?Patient taking differently: Inhale 2 puffs into the lungs daily as needed (Shortness of breath). 10/16/21  Yes Mannam, Praveen, MD  ?carvedilol (COREG) 12.5 MG tablet Take 12.5 mg by mouth 2 (two) times daily. 01/14/22  Yes [provider]  ?Cholecalciferol (VITAMIN D-3 PO) Take 1 capsule by mouth daily.   Yes [provider]  ?FOLIC ACID PO Take 1 tablet by mouth daily.   Yes [provider]  ?furosemide (LASIX) 20 MG tablet Take 1 tablet (20 mg total) by mouth every morning. ?Patient taking differently: Take 20-40 mg by mouth every morning. 10/14/21  Yes Mardella Layman, MD  ?GARLIC PO Take 1 capsule by mouth daily.   Yes [provider]  ?levofloxacin (LEVAQUIN) 750 MG tablet Take 1 tablet (750 mg total) by mouth daily. ?Patient taking differently: Take 750 mg by mouth daily. 14 day course. Pt is on day 9. 01/20/22  Yes Mannam, Praveen, MD  ?lisinopril (ZESTRIL) 20 MG tablet Take 20 mg by mouth 2 (two) times daily. 11/27/21  Yes [provider]  ?MAGNESIUM PO Take 1 tablet by mouth daily.   Yes [provider]  ?Multiple Vitamin (MULTIVITAMIN ADULT) TABS Take 1 tablet by mouth in the morning.   Yes [provider]  ?   ? ?Allergies    ?Metoprolol tartrate   ? ?Review of Systems   ?Review of Systems  ?Constitutional:  Negative for chills and fever.  ?HENT:  Negative for congestion and sinus  pain.   ?Respiratory:  Positive for cough and shortness of breath. Negative for apnea, choking, chest tightness and wheezing.   ?Cardiovascular:  Positive for leg swelling. Negative for chest pain and palpitations.  ?Gastrointestinal:  Positive for vomiting. Negative for abdominal pain, blood in stool, diarrhea and nausea.  ?Genitourinary:  Negative for dysuria.  ?Skin:  Negative for color change, pallor and wound.  ?Neurological:  Negative for seizures and weakness.  ?All other systems reviewed  and are negative. ? ?Physical Exam ?Updated Vital Signs ?BP (!) 140/114   Pulse 91   Temp 97.7 ?F (36.5 ?C)   Resp (!) 24   Ht 6\' 2"  (1.88 m)   Wt (!) 158 kg   SpO2 96%   BMI 44.72 kg/m?  ?Physical Exam ?Vitals and nursing note reviewed.  ?Constitutional:   ?   General: He is not in acute distress. ?   Appearance: Normal appearance. He is well-developed. He is obese. He is not ill-appearing, toxic-appearing or diaphoretic.  ?HENT:  ?   Head: Normocephalic and atraumatic.  ?   Mouth/Throat:  ?   Mouth: Mucous membranes are moist.  ?   Pharynx: Oropharynx is clear.  ?Eyes:  ?   Extraocular Movements: Extraocular movements intact.  ?   Pupils: Pupils are equal, round, and reactive to light.  ?Neck:  ?   Vascular: JVD present.  ?Cardiovascular:  ?   Rate and Rhythm: Normal rate and regular rhythm.  ?   Pulses: Normal pulses.  ?   Heart sounds: Normal heart sounds.  ?Pulmonary:  ?   Effort: Pulmonary effort is normal.  ?   Breath sounds: Decreased breath sounds present. No wheezing, rhonchi or rales.  ?Chest:  ?   Chest wall: No tenderness.  ?Abdominal:  ?   General: Bowel sounds are normal.  ?   Palpations: Abdomen is soft.  ?   Tenderness: There is no abdominal tenderness. There is no rebound.  ?Musculoskeletal:     ?   General: Normal range of motion.  ?   Cervical back: Normal range of motion and neck supple.  ?   Right lower leg: No tenderness. Edema present.  ?   Left lower leg: No tenderness. Edema present.  ?   Comments: 2-3+ pitting edema noted in bilateral lower extremities.  Dorsalis pedis pulses equal full bilaterally.  ?Skin: ?   General: Skin is warm and dry.  ?   Capillary Refill: Capillary refill takes less than 2 seconds.  ?Neurological:  ?   General: No focal deficit present.  ?   Mental Status: He is alert and oriented to person, place, and time.  ?Psychiatric:     ?   Mood and Affect: Mood normal.     ?   Behavior: Behavior normal.  ? ? ?ED Results / Procedures / Treatments   ?Labs ?(all labs  ordered are listed, but only abnormal results are displayed) ?Labs Reviewed  ?CBC WITH DIFFERENTIAL/PLATELET - Abnormal; Notable for the following components:  ?    Result Value  ? nRBC 0.4 (*)   ? All other components within normal limits  ?COMPREHENSIVE METABOLIC PANEL - Abnormal; Notable for the following components:  ? Glucose, Bld 117 (*)   ? Calcium 8.6 (*)   ? Total Protein 5.5 (*)   ? Albumin 3.3 (*)   ? ALT 45 (*)   ? Total Bilirubin 3.1 (*)   ? All other components within normal limits  ?BRAIN NATRIURETIC PEPTIDE -  Abnormal; Notable for the following components:  ? B Natriuretic Peptide 859.3 (*)   ? All other components within normal limits  ?COMPREHENSIVE METABOLIC PANEL - Abnormal; Notable for the following components:  ? Sodium 147 (*)   ? Potassium 3.4 (*)   ? Glucose, Bld 113 (*)   ? Calcium 8.6 (*)   ? Total Protein 5.1 (*)   ? Albumin 3.1 (*)   ? Alkaline Phosphatase 34 (*)   ? Total Bilirubin 3.1 (*)   ? All other components within normal limits  ?MAGNESIUM - Abnormal; Notable for the following components:  ? Magnesium 1.6 (*)   ? All other components within normal limits  ?TROPONIN I (HIGH SENSITIVITY) - Abnormal; Notable for the following components:  ? Troponin I (High Sensitivity) 31 (*)   ? All other components within normal limits  ?TROPONIN I (HIGH SENSITIVITY) - Abnormal; Notable for the following components:  ? Troponin I (High Sensitivity) 33 (*)   ? All other components within normal limits  ?LIPASE, BLOOD  ?HIV ANTIBODY (ROUTINE TESTING W REFLEX)  ?MAGNESIUM  ?COMPREHENSIVE METABOLIC PANEL  ? ? ?EKG ?EKG Interpretation ? ?Date/Time:  Tuesday Feb 03 2022 03:53:40 EDT ?Ventricular Rate:  110 ?PR Interval:  150 ?QRS Duration: 88 ?QT Interval:  350 ?QTC Calculation: 473 ?R Axis:   -16 ?Text Interpretation: Sinus tachycardia Biatrial enlargement Pulmonary disease pattern Septal infarct , age undetermined Abnormal ECG When compared with ECG of 28-Nov-2021 17:56, PREVIOUS ECG IS PRESENT when  compared to prior, similar appearance. No STEMI Confirmed by Theda Belfast ([id number]) on 02/03/2022 7:57:34 AM ? ?Radiology ?DG Chest 2 View ? ?Result Date: 02/03/2022 ?CLINICAL DATA:  Shortness of breath, vomiting, hist

## 2022-02-03 NOTE — ED Notes (Signed)
This RN called pt to return for further evaluation of treatment d/t elevated labs. Pt states he will return shortly.  ?

## 2022-02-03 NOTE — H&P (Signed)
?Date: 02/03/2022     ?     ?     ?Patient Name:  Mitchell Ochoa MRN: [id number]  ?DOB: Jan 12, 1981 Age / Sex: 41 y.o., male   ?PCP: Barnie Mort, PA-C    ?     ?Medical Service: Internal Medicine Teaching Service    ?     ?Attending Physician: Mitchell Ochoa, Mitchell Leigh, [facility]    ?First Contact: Mitchell Highland, Mitchell Ochoa Pager: [phone]  ?Second Contact: Mitchell Haller, Mitchell Ochoa Pager: [phone]  ?     ?After Hours (After 5p/  First Contact Pager: [phone]  ?weekends / holidays): Second Contact Pager: [phone]  ? ?SUBJECTIVE  ? ?Chief Complaint: Shortness of Breath , coughed up pink frothy sputum ? ?History of Present Illness: Mitchell Ochoa is a 41 year old male living with HTN,  HFrEF , and non-ischemic cardiomyopathy who presents with shortness of breath and episode of cough productive of pink frothy sputum.  Patient had feeling of congestion in his throat this morning while watching television. He was coughing up pink frothy sputum this morning and feeling in his throat would not go away. He has not been taking his medication as prescribed. He has not taken any medication in the last 2 days with exception of Lasix. He stopped responding to oral Lasix 40 mg.  He has tried plant based diet and taking garlic previously for heart failure. He is really frustrated with having heart failure and does not want to take a lot of medications. He is okay with taking Metoprolol Tartrate, Lisinopril , and Lasix.  ? ?Patient's primary cardiologist is Mitchell Kirk, Mitchell Ochoa. He underwent recent TTE and cardiac catheterization.  ? ?Review of Systems  ?Constitutional:  Negative for chills and fever.  ?HENT:  Negative for sinus pain and sore throat.   ?Eyes:  Negative for blurred vision and double vision.  ?Respiratory:  Positive for cough and shortness of breath.   ?Cardiovascular:  Positive for chest pain (2 episodes), orthopnea, leg swelling and PND.  ?Gastrointestinal:  Positive for constipation. Negative for blood in stool and melena.   ?Genitourinary:  Negative for flank pain and hematuria.  ?Musculoskeletal:  Negative for falls and myalgias.  ?Skin:  Negative for itching and rash.  ?Neurological:  Negative for dizziness and headaches.  ?Endo/Heme/Allergies:  Negative for environmental allergies.  ?Psychiatric/Behavioral:  Negative for depression and substance abuse.    ? ?Meds:  ?Current Meds  ?Medication Sig  ? albuterol (VENTOLIN HFA) 108 (90 Base) MCG/ACT inhaler Inhale 1-2 puffs into the lungs every 6 (six) hours as needed for wheezing or shortness of breath.  ? budesonide-formoterol (SYMBICORT) 160-4.5 MCG/ACT inhaler Inhale 2 puffs into the lungs in the morning and at bedtime. (Patient taking differently: Inhale 2 puffs into the lungs daily as needed (Shortness of breath).)  ? carvedilol (COREG) 12.5 MG tablet Take 12.5 mg by mouth 2 (two) times daily.  ? Cholecalciferol (VITAMIN D-3 PO) Take 1 capsule by mouth daily.  ? FOLIC ACID PO Take 1 tablet by mouth daily.  ? furosemide (LASIX) 20 MG tablet Take 1 tablet (20 mg total) by mouth every morning. (Patient taking differently: Take 20-40 mg by mouth every morning.)  ? GARLIC PO Take 1 capsule by mouth daily.  ? levofloxacin (LEVAQUIN) 750 MG tablet Take 1 tablet (750 mg total) by mouth daily. (Patient taking differently: Take 750 mg by mouth daily. 14 day course. Pt is on day 9.)  ? lisinopril (ZESTRIL) 20 MG tablet Take 20  mg by mouth 2 (two) times daily.  ? MAGNESIUM PO Take 1 tablet by mouth daily.  ? Multiple Vitamin (MULTIVITAMIN ADULT) TABS Take 1 tablet by mouth in the morning.  ? [DISCONTINUED] spironolactone (ALDACTONE) 25 MG tablet Take 12.5 mg by mouth daily.  ? ? ?Past Medical History:  ?Diagnosis Date  ? CHF (congestive heart failure) (HCC)   ? Hypertension   ? ? ? ?History reviewed. No pertinent surgical history. ? ?Social:  ?Social History  ? ?Tobacco Use  ? Smoking status: Never  ? Smokeless tobacco: Never  ?Vaping Use  ? Vaping Use: Never used  ?Substance Use Topics  ?  Alcohol use: Not Currently  ? Drug use: Not Currently  ?  Types: Marijuana  ?  Comment: quit 09/29/21  ? ? ?Family History:  ?Family History  ?Problem Relation Age of Onset  ? Hypertension Mother   ? Hypertension Father   ? ? ?Allergies: ?Allergies as of 02/03/2022 - Review Complete 02/03/2022  ?Allergen Reaction Noted  ? Metoprolol tartrate Other (See Comments) 01/02/2022  ? ? ?Review of Systems: ?A complete ROS was negative except as per HPI.  ? ?OBJECTIVE:  ? ?Physical Exam: ?Blood pressure (!) 154/112, pulse 94, temperature 98.6 ?F (37 ?C), temperature source Oral, resp. rate (!) 25, height 6\' 2"  (1.88 m), weight (!) 158 kg, SpO2 95 %.  ?General appearance: Well developed, NAD ?Eyes: anicteric sclerae, moist conjunctivae; no tracking appropriately ?HENT: oropharynx, MMM, multiple red macules on hard palate, erythematous soft palate ?Neck: Trachea midline , no lymphadenopathy  ?Lungs: Decreased breath sound bilaterally , no crackles, no wheeze, with normal respiratory effort. ?CV: RRR, no MRGs , no appreciable JVD  ?Abdomen: Soft, non-tender; BS present  ?Extremities: 2+ peripheral edema to shins ?Skin: Normal temperature, turgor and texture; no rash ?Psych: Appropriate affect ?Neuro: Alert and oriented to person and place, no focal deficit  ? ? ?Labs: ?CBC ?   ?Component Value Date/Time  ? WBC 4.7 02/03/2022 0410  ? RBC 5.51 02/03/2022 0410  ? HGB 17.0 02/03/2022 0410  ? HCT 49.2 02/03/2022 0410  ? PLT 223 02/03/2022 0410  ? MCV 89.3 02/03/2022 0410  ? MCH 30.9 02/03/2022 0410  ? MCHC 34.6 02/03/2022 0410  ? RDW 15.1 02/03/2022 0410  ? LYMPHSABS 2.2 02/03/2022 0410  ? MONOABS 0.4 02/03/2022 0410  ? EOSABS 0.1 02/03/2022 0410  ? BASOSABS 0.0 02/03/2022 0410  ?  ? ?CMP  ?   ?Component Value Date/Time  ? NA 141 02/03/2022 0410  ? K 3.8 02/03/2022 0410  ? CL 109 02/03/2022 0410  ? CO2 23 02/03/2022 0410  ? GLUCOSE 117 (H) 02/03/2022 0410  ? BUN 10 02/03/2022 0410  ? CREATININE 0.94 02/03/2022 0410  ? CALCIUM 8.6 (L)  02/03/2022 0410  ? PROT 5.5 (L) 02/03/2022 0410  ? ALBUMIN 3.3 (L) 02/03/2022 0410  ? AST 38 02/03/2022 0410  ? ALT 45 (H) 02/03/2022 0410  ? ALKPHOS 40 02/03/2022 0410  ? BILITOT 3.1 (H) 02/03/2022 0410  ? GFRNONAA >60 02/03/2022 0410  ? GFRAA >60 11/04/2019 0003  ? ? ?Imaging: ?DG Chest 2 View ? ?Result Date: 02/03/2022 ?CLINICAL DATA:  Shortness of breath, vomiting, history CHF. EXAM: CHEST - 2 VIEW COMPARISON:  CT 01/08/2022 without contrast. FINDINGS: The heart is moderately enlarged. There is central vascular prominence. There is mild interstitial edema in the lower lung fields. There is perihilar interstitial haziness which could be ground-glass edema or pneumonitis, with elevated right hemidiaphragm again shown. There is  a stable mediastinal configuration. Slight aortic tortuosity. There are minimal pleural effusions. The upper lung fields are clear.  No acute osseous findings. IMPRESSION: Cardiomegaly with perihilar vascular congestion and mild interstitial edema. Findings consistent with CHF or fluid overload. Perihilar haziness most likely reflects ground-glass edema, less likely pneumonitis. Minimal pleural effusions are beginning to develop. Clinical correlation and radiographic follow-up recommended. Electronically Signed   By: Almira Bar M.D.   On: 02/03/2022 05:01  ? ?EKG: personally reviewed my interpretation is sinus tachycardia. Biatrial Enlargement. No acute changes compared to previous EKG on 12/01/2021. ? ? ?ASSESSMENT & PLAN:  ? ? ?Assessment & Plan by Problem: ?Principal Problem: ?  Heart failure (HCC) ? ? ?GRAESON NOURI is a 41 y.o. living with HFrEF who presents in acute heart failure exacerbation in setting of medication non adherence.  ? ?#Acute on Chronic Heart Failure with reduced ejection fraction ?Patient follows with cardiologist at [facility].  TEE 12/15/2021 showed EF of 20-25%, mildly dilated right and left ventricles, and estimated right ventricular systolic pressure.  He had  recent left heart catheterization which showed minor nonobstructive artery disease ,LVEDP of 35 mmHg, and significant decreased LV dysfunction. Patient cardiologist has plans for cardiac MRI for further evalu

## 2022-02-03 NOTE — ED Notes (Signed)
Patient ambulated around nurses station twice with steady gait. Denies any shortness of breath. Oxygen saturation maintained 90% or greater while ambulating.  ?

## 2022-02-03 NOTE — ED Notes (Addendum)
Mr,Plato FEELS LIKE THIS PLACE IS A LACK OF EMERRGENCY AN CAN GO SOMEWHERE ELSE ?FOR BETTER TREATMENT .STATED MR.Adams  LEFTED.HE REFUSES VITALSIGNS. ?

## 2022-02-03 NOTE — Consult Note (Signed)
?Cardiology Consultation:  ? ?Patient ID: Mitchell Ochoa ?MRN: [id number]; DOB: [birth year] ? ?Admit date: 02/03/2022 ?Date of Consult: 02/03/2022 ? ?PCP:  Barnie Mort, PA-C ?  ?[facility] Providers ?Cardiologist:  None   { ?   ? ? ?Patient Profile:  ? ?Mitchell Ochoa is a 41 y.o. male with a hx of NICM with LVEF 20-25% and HTN who is being seen 02/03/2022 for the evaluation of acute on chronic systolic HF exacerbation at the request of Dr. Ninetta Lights. ? ?History of Present Illness:  ? ?Mitchell Ochoa is a 41 year old male with history detailed above who follows with Dr. Fransisco Beau at [facility] for his NICM. Per review of their record, the patient was initially diagnosed with systolic heart failure in 09/2021 where he presented with worsening SOB found to be volume overloaded with BNP in the 500s. TTE 11/2021 with LVEF 20-25%, mild LV dilation, mild RV dilation with mildly reduced systolic function mild MR, PASP 37-88mmHg. Cath 12/2021 with 20-30% Lcx disease but no obstructive CAD. LVEDP . Has had issues with medication compliance and dietary compliance. He was recommended for CMR, however, this has not occurred ? ?He presents on this admission with worsening SOB with cough productive of pink frothy sputum. Has not been taking his medications at home except his lasix. In the ER, BNP 859, trop 31>33. Was given lasix 40mg  IV with brisk response. ? ?Currently, the patient states he is feeling a little better. He is overwhelmed by the number of medications he has to take and his new diagnosis. He states that he really wants to get better and is willing to try and figure out a good regimen for him going forward to help improve his quality of life and prevent him from being re-hospitalized.  ? ? ?Past Medical History:  ?Diagnosis Date  ? CHF (congestive heart failure) (HCC)   ? Hypertension   ? ? ?History reviewed. No pertinent surgical history.  ? ?Home Medications:  ?Prior to Admission medications   ?Medication Sig  Start Date End Date Taking? Authorizing Provider  ?albuterol (VENTOLIN HFA) 108 (90 Base) MCG/ACT inhaler Inhale 1-2 puffs into the lungs every 6 (six) hours as needed for wheezing or shortness of breath. 10/14/21  Yes Hagler, Arlys John, MD  ?budesonide-formoterol (SYMBICORT) 160-4.5 MCG/ACT inhaler Inhale 2 puffs into the lungs in the morning and at bedtime. ?Patient taking differently: Inhale 2 puffs into the lungs daily as needed (Shortness of breath). 10/16/21  Yes Mannam, Praveen, MD  ?carvedilol (COREG) 12.5 MG tablet Take 12.5 mg by mouth 2 (two) times daily. 01/14/22  Yes [provider]  ?Cholecalciferol (VITAMIN D-3 PO) Take 1 capsule by mouth daily.   Yes [provider]  ?FOLIC ACID PO Take 1 tablet by mouth daily.   Yes [provider]  ?furosemide (LASIX) 20 MG tablet Take 1 tablet (20 mg total) by mouth every morning. ?Patient taking differently: Take 20-40 mg by mouth every morning. 10/14/21  Yes Mardella Layman, MD  ?GARLIC PO Take 1 capsule by mouth daily.   Yes [provider]  ?levofloxacin (LEVAQUIN) 750 MG tablet Take 1 tablet (750 mg total) by mouth daily. ?Patient taking differently: Take 750 mg by mouth daily. 14 day course. Pt is on day 9. 01/20/22  Yes Mannam, Praveen, MD  ?lisinopril (ZESTRIL) 20 MG tablet Take 20 mg by mouth 2 (two) times daily. 11/27/21  Yes [provider]  ?MAGNESIUM PO Take 1 tablet by mouth daily.   Yes  [provider]  ?Multiple Vitamin (MULTIVITAMIN ADULT) TABS Take 1 tablet by mouth in the morning.   Yes [provider]  ? ? ?Inpatient Medications: ?Scheduled Meds: ? carvedilol  12.5 mg Oral BID  ? furosemide  40 mg Intravenous BID  ? lisinopril  20 mg Oral BID  ? potassium chloride  30 mEq Oral Q6H  ? rivaroxaban  10 mg Oral Daily  ? sodium chloride flush  3 mL Intravenous Q12H  ? ?Continuous Infusions: ? sodium chloride    ? magnesium sulfate bolus IVPB    ? ?PRN Meds: ?sodium chloride, acetaminophen, ondansetron  (ZOFRAN) IV, sodium chloride flush ? ?Allergies:    ?Allergies  ?Allergen Reactions  ? Metoprolol Tartrate Other (See Comments)  ?  tachycardia  ? ? ?Social History:   ?Social History  ? ?Socioeconomic History  ? Marital status: Single  ?  Spouse name: Not on file  ? Number of children: Not on file  ? Years of education: Not on file  ? Highest education level: Not on file  ?Occupational History  ? Not on file  ?Tobacco Use  ? Smoking status: Never  ? Smokeless tobacco: Never  ?Vaping Use  ? Vaping Use: Never used  ?Substance and Sexual Activity  ? Alcohol use: Not Currently  ? Drug use: Not Currently  ?  Types: Marijuana  ?  Comment: quit 09/29/21  ? Sexual activity: Not Currently  ?Other Topics Concern  ? Not on file  ?Social History Narrative  ? Not on file  ? ?Social Determinants of Health  ? ?Financial Resource Strain: Not on file  ?Food Insecurity: Not on file  ?Transportation Needs: Not on file  ?Physical Activity: Not on file  ?Stress: Not on file  ?Social Connections: Not on file  ?Intimate Partner Violence: Not on file  ?  ?Family History:   ? ?Family History  ?Problem Relation Age of Onset  ? Hypertension Mother   ? Hypertension Father   ?  ? ?ROS:  ?Please see the history of present illness.  ?Review of Systems  ?Constitutional:  Positive for malaise/fatigue.  ?Respiratory:  Positive for cough and shortness of breath.   ?Cardiovascular:  Positive for orthopnea, leg swelling and PND. Negative for chest pain, palpitations and claudication.  ?Gastrointestinal:  Negative for blood in stool and melena.  ?Genitourinary:  Negative for hematuria.  ?Musculoskeletal:  Negative for falls.  ?Neurological:  Negative for dizziness and loss of consciousness.     ? ?Physical Exam/Data:  ? ?Vitals:  ? 02/03/22 1130 02/03/22 1200 02/03/22 1230 02/03/22 1429  ?BP: (!) 154/112 (!) 160/117 (!) 137/95 (!) 140/114  ?Pulse: 94 91 91 91  ?Resp: (!) 25 20 (!) 24   ?Temp:    97.7 ?F (36.5 ?C)  ?TempSrc:      ?SpO2: 95% 96% 98% 96%   ?Weight:      ?Height:      ? ? ?Intake/Output Summary (Last 24 hours) at 02/03/2022 1447 ?Last data filed at 02/03/2022 1237 ?Gross per 24 hour  ?Intake --  ?Output 4800 ml  ?Net -4800 ml  ? ? ?  02/03/2022  ?  9:30 AM 01/20/2022  ?  3:45 PM 11/28/2021  ?  6:00 PM  ?Last 3 Weights  ?Weight (lbs) 348 lb 5.2 oz 348 lb 6.4 oz 353 lb 2.8 oz  ?Weight (kg) 158 kg 158.033 kg 160.2 kg  ?   ?Body mass index is 44.72 kg/m?.  ?General:  Well nourished, well developed,  in no acute distress ?HEENT: normal ?Neck: JVD to the jaw ?Vascular: No carotid bruits; Distal pulses 2+ bilaterally ?Cardiac:  Tachycardic, regular, no murmurs ?Lungs:  bibasilar crackles ?Abd: soft, nontender, no hepatomegaly  ?Ext: 1+ pitting edema, warm ?Musculoskeletal:  No deformities, BUE and BLE strength normal and equal ?Skin: warm and dry  ?Neuro:  CNs 2-12 intact, no focal abnormalities noted ?Psych:  Normal affect  ? ?EKG:  The EKG was personally reviewed and demonstrates:  sinus tachycardia, biatrial enlargement ?Telemetry:  Telemetry was personally reviewed and demonstrates:  sinus tachycardia ? ?Relevant CV Studies: ?Cardiac catheterization 01/12/2022 ?FINDINGS: ?  ?Hemodynamics ?1. Central Aortic Pressure -147/103 mmHg ?2. Left Ventricular Pressure-155/25 mmHg ?Additional comments on on hemodynamics: LVEDP 35 mmHg.  ?  ?Coronary Angiography ?Antatomically right dominant ?No visible fluoroscopic coronary calcification  ?1. Left Main -large, normal. Bifurcates distally. ?2. Left anterior descending artery -large vessel proximally, moderate vessel mid to distally. Mild luminal irregularities primarily distally. ?3. Diagonals -3 diagonal branches. First diagonal branch is moderate in size has minor irregularities. Second and third diagonal branches are small with minor irregularities ?4. Left Circumflex -anatomically nondominant. Moderately large vessel. 20 to 30% eccentric stenosis in the proximal segment then minor diffuse irregularities. ?5. Obtuse  Marginals -first marginal branch is small tortuous with minor irregularities. Second marginal branch is also tortuous with minor irregularities. ?6. Right Coronary Artery -anatomically dominant. Moderately

## 2022-02-03 NOTE — ED Triage Notes (Addendum)
Pt reported to ED with c/o shortness of breath and coughing up blood. Pt states that he has been having reflux. States he ate lo mein tonight and felt that it was "stuck in throat" and he has been coughing trying to get it up and has vomited. States that after emesis he noticed he cough up "a couple specks of blood". Pt states he still feels like food is stuck in throat.   ?

## 2022-02-03 NOTE — ED Notes (Signed)
ED Provider at bedside. 

## 2022-02-04 ENCOUNTER — Encounter (HOSPITAL_COMMUNITY): Payer: Self-pay | Admitting: Infectious Diseases

## 2022-02-04 ENCOUNTER — Other Ambulatory Visit (HOSPITAL_COMMUNITY): Payer: Self-pay

## 2022-02-04 DIAGNOSIS — I5043 Acute on chronic combined systolic (congestive) and diastolic (congestive) heart failure: Secondary | ICD-10-CM | POA: Diagnosis not present

## 2022-02-04 DIAGNOSIS — I10 Essential (primary) hypertension: Secondary | ICD-10-CM | POA: Diagnosis not present

## 2022-02-04 DIAGNOSIS — Z87891 Personal history of nicotine dependence: Secondary | ICD-10-CM | POA: Diagnosis not present

## 2022-02-04 DIAGNOSIS — I5042 Chronic combined systolic (congestive) and diastolic (congestive) heart failure: Secondary | ICD-10-CM

## 2022-02-04 DIAGNOSIS — I110 Hypertensive heart disease with heart failure: Secondary | ICD-10-CM | POA: Diagnosis not present

## 2022-02-04 DIAGNOSIS — I5020 Unspecified systolic (congestive) heart failure: Secondary | ICD-10-CM

## 2022-02-04 LAB — COMPREHENSIVE METABOLIC PANEL
ALT: 42 U/L (ref 0–44)
AST: 35 U/L (ref 15–41)
Albumin: 3.1 g/dL — ABNORMAL LOW (ref 3.5–5.0)
Alkaline Phosphatase: 40 U/L (ref 38–126)
Anion gap: 6 (ref 5–15)
BUN: 9 mg/dL (ref 6–20)
CO2: 32 mmol/L (ref 22–32)
Calcium: 8.9 mg/dL (ref 8.9–10.3)
Chloride: 105 mmol/L (ref 98–111)
Creatinine, Ser: 0.97 mg/dL (ref 0.61–1.24)
GFR, Estimated: >60 mL/min (ref >60)
Glucose, Bld: 108 mg/dL — ABNORMAL HIGH (ref 70–99)
Potassium: 3.6 mmol/L (ref 3.5–5.1)
Sodium: 143 mmol/L (ref 135–145)
Total Bilirubin: 2.6 mg/dL — ABNORMAL HIGH (ref 0.3–1.2)
Total Protein: 5.3 g/dL — ABNORMAL LOW (ref 6.5–8.1)

## 2022-02-04 LAB — MAGNESIUM: Magnesium: 1.9 mg/dL (ref 1.7–2.4)

## 2022-02-04 LAB — BILIRUBIN, FRACTIONATED(TOT/DIR/INDIR)
Bilirubin, Direct: 0.5 mg/dL — ABNORMAL HIGH (ref 0.0–0.2)
Indirect Bilirubin: 2 mg/dL — ABNORMAL HIGH (ref 0.3–0.9)
Total Bilirubin: 2.5 mg/dL — ABNORMAL HIGH (ref 0.3–1.2)

## 2022-02-04 LAB — POTASSIUM: Potassium: 3.8 mmol/L (ref 3.5–5.1)

## 2022-02-04 MED ORDER — POTASSIUM CHLORIDE 20 MEQ PO PACK
40.0000 meq | PACK | Freq: Once | ORAL | Status: AC
  Administered 2022-02-04: 40 meq via ORAL
  Filled 2022-02-04: qty 2

## 2022-02-04 MED ORDER — ACETAZOLAMIDE SODIUM 500 MG IJ SOLR
500.0000 mg | Freq: Once | INTRAMUSCULAR | Status: AC
  Administered 2022-02-04: 500 mg via INTRAVENOUS
  Filled 2022-02-04: qty 500

## 2022-02-04 MED ORDER — STERILE WATER FOR INJECTION IJ SOLN
INTRAMUSCULAR | Status: AC
  Administered 2022-02-04: 5 mL
  Filled 2022-02-04: qty 10

## 2022-02-04 MED ORDER — POTASSIUM CHLORIDE CRYS ER 20 MEQ PO TBCR
40.0000 meq | EXTENDED_RELEASE_TABLET | Freq: Once | ORAL | Status: AC
  Administered 2022-02-04: 40 meq via ORAL
  Filled 2022-02-04: qty 2

## 2022-02-04 MED ORDER — MAGNESIUM SULFATE 2 GM/50ML IV SOLN
2.0000 g | Freq: Once | INTRAVENOUS | Status: AC
  Administered 2022-02-04: 2 g via INTRAVENOUS
  Filled 2022-02-04: qty 50

## 2022-02-04 NOTE — Hospital Course (Signed)
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  ?- CMP in am, fractioned bilirubin  ? ?

## 2022-02-04 NOTE — Progress Notes (Signed)
? ?  Subjective:  ?Patient seen and assessed at bedside. Patient reports much improvement from yesterday including shortness of breath and urine output.  He also states that he is no longer coughing. Denies chest pain. Reports lower extremity swelling has improved but is still swollen compared to baseline.  Abdominal distention has also improved significantly compared to yesterday.  Reports dry weight ~340 lb although he is now 328 lb (?).  ? ?Objective: ? ?Vital signs in last 24 hours: ?Vitals:  ? 02/03/22 1429 02/03/22 1926 02/04/22 0021 02/04/22 0403  ?BP: (!) 140/114 118/89 (!) 120/93 (!) 131/93  ?Pulse: 91 88 84 80  ?Resp:  20 18 20   ?Temp: 97.7 ?F (36.5 ?C) (!) 97.5 ?F (36.4 ?C) 98.3 ?F (36.8 ?C) 98.1 ?F (36.7 ?C)  ?TempSrc:  Oral Oral Oral  ?SpO2: 96% 98% 95% 93%  ?Weight:   (!) 149 kg   ?Height:      ? ?General: NAD, nl appearance, lying comfortably in bed ?HE: Normocephalic, atraumatic, EOMI, Conjunctivae normal ?ENT: No congestion, no rhinorrhea, no exudate or erythema  ?Cardiovascular: Normal rate, regular rhythm. No murmurs, rubs, or gallops ?Pulmonary: Effort normal, breath sounds normal. No wheezes, rales, or rhonchi ?Abdominal: soft, nontender, bowel sounds present ?Musculoskeletal: no swelling, deformity, injury or tenderness in extremities.  2+ pitting edema to the shins, R > L. ?Skin: Warm, dry, no bruising, erythema, or rash ?Psychiatric/Behavioral: normal mood, normal behavior    ? ?Assessment/Plan: ? ?Principal Problem: ?  Heart failure (HCC) ?Active Problems: ?  HTN (hypertension) ? ?#Acute on Chronic Heart Failure with reduced ejection fraction (EF 20-25%) ?#Nonischemic cardiomyopathy ?Acute presentation in the setting of recent medication noncompliance.  TTE 11/2021 with LVEF 20-25%, mild LV dilation, mild RV systolic dysfunction, mild MR. Cath with mild, nonobstructive disease.  ?Patient was started on IV Lasix 40 mg twice daily here and has responded well.  Urine output of 7.4 L +1  unmeasured void in the past 24 hours.  His weight is currently 149 kg, down from 158 kg yesterday.  The patient states that his dry weight is around 154 kg, however he continues to appear volume overloaded on exam.   ?-Cardiology is on board, appreciate their recommendations ?-Continue IV Lasix 40 mg twice daily ?-Continue Coreg 12.5 mg twice daily ?-Continue Entresto 24-26 mg BID ?-Continue Farxiga 10 mg daily ?-Continue spironolactone 12.5 mg daily ?-Strict Ins and outs  ?-Daily weights ?-Trend BMP ?-Maintain Mg>2, K>4 ?-Will need cardiac MRI as outpatient ?  ?#HTN ?Patient is on lisinopril and Coreg at home.  Systolic blood pressures have been ranging in the 110s-130s most recently. ?-Continue home Coreg ?-Holding Lisinopril per cardiology ?  ?Prior to Admission Living Arrangement: Home ?Anticipated Discharge Location: Home ?Barriers to Discharge: Medical stability ?Dispo: Anticipated discharge in approximately 1-2 day(s).  ? ?12/2021, MD ?02/04/2022, 6:58 AM ?Pager: [phone]  ?After 5pm on weekdays and 1pm on weekends: On Call pager [phone] ? ?

## 2022-02-04 NOTE — Progress Notes (Signed)
Heart Failure Nurse Navigator Progress Note ? ?PCP: Barnie Mort, PA-C ?PCP-Cardiologist: [place] ?Admission Diagnosis: Acute on chronic heart failure, hypoxia ?Admitted from: Home ? ?Presentation:   ?Mitchell Ochoa presented with shortness of breath and coughing up blood. Bilateral leg swelling + 2-3, BP 140/114, HR 91,JVD present per MD note, decreased breath sounds, BNP 859.36, Troponin 31, IV lasix given, cardiology consulted and admitted.  ? ?Patient was interviewed at bedside. Educated on the signs and symptoms of heart failure,, when to call his doctor or go to the ER. Education on daily weights, medication compliance. Patient stated he hasn't been taking his cardiac medications like he should because of the side effects he was having. Diet/ fluid restrictions were discussed, as well as the importance of keeping his doctor appointments. Patient verbalized his understanding of the education and asked questions with regard to the Heart failure book.  ? ?Patient discussed his want to change his PCP and cardiologist, who are both currently in [place]. Would like a PCP and cardiologist in [place] where he lives. Spoke with [facility] who arranged for a new PCP, reached out to Pharmacy to come and speak with patient about his medications and different side effects he was experiencing. Patient was scheduled for a hospital follow up in the HF TOC on 02/16/22 @ 12 noon.  ? ?ECHO/ LVEF: 20-25% ? ?Clinical Course: ? ?Past Medical History:  ?Diagnosis Date  ? CHF (congestive heart failure) (HCC)   ? Hypertension   ?  ? ?Social History  ? ?Socioeconomic History  ? Marital status: Single  ?  Spouse name: Not on file  ? Number of children: 3  ? Years of education: Not on file  ? Highest education level: High school graduate  ?Occupational History  ? Not on file  ?Tobacco Use  ? Smoking status: Former  ?  Years: 20.00  ?  Types: Cigarettes  ?  Quit date: 09/30/2021  ?  Years since quitting: 0.3  ? Smokeless tobacco: Never   ?Vaping Use  ? Vaping Use: Never used  ?Substance and Sexual Activity  ? Alcohol use: Not Currently  ?  Comment: socially  ? Drug use: Not Currently  ?  Types: Marijuana  ?  Comment: quit 09/29/21  ? Sexual activity: Not Currently  ?Other Topics Concern  ? Not on file  ?Social History Narrative  ? Not on file  ? ?Social Determinants of Health  ? ?Financial Resource Strain: Low Risk   ? Difficulty of Paying Living Expenses: Not very hard  ?Food Insecurity: No Food Insecurity  ? Worried About Programme researcher, broadcasting/film/video in the Last Year: Never true  ? Ran Out of Food in the Last Year: Never true  ?Transportation Needs: No Transportation Needs  ? Lack of Transportation (Medical): No  ? Lack of Transportation (Non-Medical): No  ?Physical Activity: Insufficiently Active  ? Days of Exercise per Week: 4 days  ? Minutes of Exercise per Session: 20 min  ?Stress: Not on file  ?Social Connections: Not on file  ? ?Education Assessment and Provision: ? ?Detailed education and instructions provided on heart failure disease management including the following: ? ?Signs and symptoms of Heart Failure ?When to call the physician ?Importance of daily weights ?Low sodium diet ?Fluid restriction ?Medication management ?Anticipated future follow-up appointments ? ?Patient education given on each of the above topics.  Patient acknowledges understanding via teach back method and acceptance of all instructions. ? ?Education Materials:  "Living Better With Heart Failure" Booklet,  HF zone tool, & Daily Weight Tracker Tool. ? ?Patient has scale at home: yes ?Patient has pill box at home: yes ?   ?High Risk Criteria for Readmission and/or Poor Patient Outcomes: ?Heart failure hospital admissions (last 6 months): 0  ?No Show rate: 23% ?Difficult social situation: No ?Demonstrates medication adherence: No, per patient, because of the side effects. ?Primary Language: English ?Literacy level: Reading, writing, and comprehension.  ? ?Barriers of Care:    ?Medication compliance ?Diet/fluid restrictions ?Daily weights ? ?Considerations/Referrals:  ? ?Referral made to Heart Failure Pharmacist Stewardship: yes ?Referral made to Heart Failure CSW/NCM TOC: yes ?Referral made to Heart & Vascular TOC clinic: yes, 02/16/22 @ 12 noon. ? ?Items for Follow-up on DC/TOC: ?Medication compliance/ any side effects? ?Diet/fluid restrictions ?Daily weights ? ? ? ?Rhae Hammock, BSN, RN ?Heart Failure Nurse Navigator ?Secure Chat Only   ?

## 2022-02-04 NOTE — Progress Notes (Signed)
Heart Failure Stewardship Pharmacist Progress Note ? ? ?PCP: Barnie Mort, PA-C ?PCP-Cardiologist: None  ? ? 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 History of dietary and medication noncompliance.  ?Presented to [facility] ED with dyspnea in the setting of dietary and medication noncompliance due to concern for side effects of medicines. CXR with cardiomegaly, vascular congestion and mild interstitial edema.  CTA negative for PE.  Most recent Echo 03/27 showed LVEF of 20-25%, mild biatrial dilation, mildly reduced RV function, and mild MR.  Started on IV diuresis. ? ?Of note, patient is followed by the [facility] clinic. At 03/09 office visit, his lisinopril was increased to 20 mg twice daily and Toprol to 50 mg daily (HR 102) with Lasix QOD.  Patient reported having significant dyspnea after taking Toprol dose and presented to the ED with trace edema in lower extremities and hypertension.  Followed up in cardiology clinic with persistent trace LEE but reported improvement in dyspnea.  Spironolactone 25 mg daily added, Toprol switched to Coreg 6.25 mg BID, lisinopril reduced to 20 mg daily, furosemide continued at 20 mg daily.  Presented for planned [facility] 04/24 showing nonobstructive CAD.  Returned to cardiology clinic 04/26 where patient reported PND secondary to 3 days of diuretic noncompliance.  Symptoms improved after taking Lasix dose.  He was switched from lisinopril to Entresto 24-26 mg BID and Farxiga 5 mg daily was added.  ? ? ? ?Current HF Medications: ?Diuretic: furosemide IV 40 mg twice daily ?Beta Blocker: carvedilol 12.5 mg twice daily ?ACE/ARB/ARNI: Sherryll Burger 24-26 mg twice daily ?Aldosterone Antagonist: spironolactone 12.5 mg daily ?SGLT2i: Farxiga 10 mg daily ? ?Prior to admission HF Medications: ?Diuretic: furosemide 20 mg daily ?Beta blocker: carvedilol 12.5 mg twice daily ?ACE/ARB/ARNI:  lisinopril 20 mg twice daily ? ?Pertinent Lab Values: ?Serum creatinine 0.97, BUN 9, Potassium 3.6, Sodium 143, BNP 869.3, Magnesium 1.9, A1c 3.9% (12/2021) ? ?Vital Signs: ?Weight: 328 lbs (admission weight: 348 lbs) ?Blood pressure: 130s/90s ?Heart rate: 80s  ?I/O: -7 L yesterday; net -6.47L ? ?Medication Assistance / Insurance Benefits Check: ?Does the patient have prescription insurance?  Yes ?Type of insurance plan: Commercial  ? ?Outpatient Pharmacy:  ?Prior to admission outpatient pharmacy: [facility]  ?Is the patient willing to use [facility] TOC pharmacy at discharge? Yes ?Is the patient willing to transition their outpatient pharmacy to utilize a [facility] outpatient pharmacy?   Yes ?  ? ?Assessment: ?1. Acute on chronic systolic CHF (LVEF 20-25%). NYHA class IV symptoms. ?- Continue IV diuresis with furosemide 40 mg twice daily ?- Continue carvedilol 12.5 mg twice daily ?- Continue Entresto 24-26 mg twice daily ?- Continue spironolactone 12.5 mg daily ?- Continue Farxiga 10 mg daily ?  ?Plan: ?1) Medication changes recommended at this time: ?- Give 2g IV Mg x1 ?- Give potassium 40 mEq x2 ?- Consider increasing Entresto to 49-51 mg twice daily tomorrow if BP allows ? ?2) Patient assistance: ?- Entresto copay $35 > $10 copay card ?- Farxiga $15 - copay card to bring down to $0 ? ?3)  Education  ?- Patient has been educated on current HF medications and potential additions to HF medication regimen ?- Patient verbalizes understanding that over the next few months, these medication doses may change and more medications may be added to optimize HF regimen ?- Patient has been educated on basic disease state pathophysiology and goals of therapy ? ? ?  Filbert Schilder, PharmD ?PGY1 Pharmacy Resident ?02/04/2022  7:57 AM ? ?

## 2022-02-04 NOTE — Progress Notes (Addendum)
? ?Progress Note ? ?Patient Name: Mitchell Ochoa ?Date of Encounter: 02/04/2022 ? ?[facility] Cardiologist: None  ? ?Subjective  ? ?Net -6.2 L yesterday.  BP 133/92.  Stable creatinine (0.9 > 0.97).  Reports dyspnea improved ? ?Inpatient Medications  ?  ?Scheduled Meds: ? acetaZOLAMIDE  500 mg Intravenous Once  ? carvedilol  12.5 mg Oral BID  ? dapagliflozin propanediol  10 mg Oral Daily  ? furosemide  40 mg Intravenous BID  ? hydrALAZINE  25 mg Oral Q8H  ? rivaroxaban  10 mg Oral Daily  ? [START ON 02/05/2022] sacubitril-valsartan  1 tablet Oral BID  ? sodium chloride flush  3 mL Intravenous Q12H  ? spironolactone  12.5 mg Oral Daily  ? ?Continuous Infusions: ? sodium chloride Stopped (02/03/22 1600)  ? ?PRN Meds: ?sodium chloride, acetaminophen, ondansetron (ZOFRAN) IV, sodium chloride flush  ? ?Vital Signs  ?  ?Vitals:  ? 02/04/22 0409 02/04/22 0410 02/04/22 0733 02/04/22 0735  ?BP:   (!) 133/92   ?Pulse: 85 84 90   ?Resp:   20   ?Temp:    98.1 ?F (36.7 ?C)  ?TempSrc:      ?SpO2: 93% 98% 96%   ?Weight:      ?Height:      ? ? ?Intake/Output Summary (Last 24 hours) at 02/04/2022 1113 ?Last data filed at 02/04/2022 0900 ?Gross per 24 hour  ?Intake 1577.38 ml  ?Output 4025 ml  ?Net -2447.62 ml  ? ? ?  02/04/2022  ? 12:21 AM 02/03/2022  ?  9:30 AM 01/20/2022  ?  3:45 PM  ?Last 3 Weights  ?Weight (lbs) 328 lb 7.8 oz 348 lb 5.2 oz 348 lb 6.4 oz  ?Weight (kg) 149 kg 158 kg 158.033 kg  ?   ? ?Telemetry  ?  ?Normal sinus rhythm, NSVT x4 beats- Personally Reviewed ? ?ECG  ?  ?No new ECG- Personally Reviewed ? ?Physical Exam  ? ?GEN: No acute distress.   ?Neck: + JVD ?Cardiac: RRR, no murmurs, rubs, or gallops.  ?Respiratory: Clear to auscultation bilaterally. ?GI: Soft, nontender, non-distended  ?MS: 1+ edema ?Neuro:  Nonfocal  ?Psych: Normal affect  ? ?Labs  ?  ?High Sensitivity Troponin:   ?Recent Labs  ?Lab 02/03/22 ?0410 02/03/22 ?0706  ?TROPONINIHS 31* 33*  ?   ?Chemistry ?Recent Labs  ?Lab 02/03/22 ?0410 02/03/22 ?1306  02/04/22 ?0451 02/04/22 ?1093  ?NA 141 147* 143  --   ?K 3.8 3.4* 3.6  --   ?CL 109 109 105  --   ?CO2 23 28 32  --   ?GLUCOSE 117* 113* 108*  --   ?BUN 10 9 9   --   ?CREATININE 0.94 0.90 0.97  --   ?CALCIUM 8.6* 8.6* 8.9  --   ?MG  --  1.6* 1.9  --   ?PROT 5.5* 5.1* 5.3*  --   ?ALBUMIN 3.3* 3.1* 3.1*  --   ?AST 38 36 35  --   ?ALT 45* 43 42  --   ?ALKPHOS 40 34* 40  --   ?BILITOT 3.1* 3.1* 2.6* 2.5*  ?GFRNONAA >60 >60 >60  --   ?ANIONGAP 9 10 6   --   ?  ?Lipids No results for input(s): CHOL, TRIG, HDL, LABVLDL, LDLCALC, CHOLHDL in the last 168 hours.  ?Hematology ?Recent Labs  ?Lab 02/03/22 ?0410  ?WBC 4.7  ?RBC 5.51  ?HGB 17.0  ?HCT 49.2  ?MCV 89.3  ?MCH 30.9  ?MCHC 34.6  ?RDW 15.1  ?PLT  223  ? ?Thyroid No results for input(s): TSH, FREET4 in the last 168 hours.  ?BNP ?Recent Labs  ?Lab 02/03/22 ?0410  ?BNP 859.3*  ?  ?DDimer No results for input(s): DDIMER in the last 168 hours.  ? ?Radiology  ?  ?DG Chest 2 View ? ?Result Date: 02/03/2022 ?CLINICAL DATA:  Shortness of breath, vomiting, history CHF. EXAM: CHEST - 2 VIEW COMPARISON:  CT 01/08/2022 without contrast. FINDINGS: The heart is moderately enlarged. There is central vascular prominence. There is mild interstitial edema in the lower lung fields. There is perihilar interstitial haziness which could be ground-glass edema or pneumonitis, with elevated right hemidiaphragm again shown. There is a stable mediastinal configuration. Slight aortic tortuosity. There are minimal pleural effusions. The upper lung fields are clear.  No acute osseous findings. IMPRESSION: Cardiomegaly with perihilar vascular congestion and mild interstitial edema. Findings consistent with CHF or fluid overload. Perihilar haziness most likely reflects ground-glass edema, less likely pneumonitis. Minimal pleural effusions are beginning to develop. Clinical correlation and radiographic follow-up recommended. Electronically Signed   By: Almira Bar M.D.   On: 02/03/2022 05:01  ? ?CT Angio  Chest PE W/Cm &/Or Wo Cm ? ?Result Date: 02/03/2022 ?CLINICAL DATA:  Pulmonary embolism (PE) suspected, high prob EXAM: CT ANGIOGRAPHY CHEST WITH CONTRAST TECHNIQUE: Multidetector CT imaging of the chest was performed using the standard protocol during bolus administration of intravenous contrast. Multiplanar CT image reconstructions and MIPs were obtained to evaluate the vascular anatomy. RADIATION DOSE REDUCTION: This exam was performed according to the departmental dose-optimization program which includes automated exposure control, adjustment of the mA and/or kV according to patient size and/or use of iterative reconstruction technique. CONTRAST:  63mL OMNIPAQUE IOHEXOL 350 MG/ML SOLN COMPARISON:  01/08/2022 FINDINGS: Cardiovascular: Satisfactory opacification of the pulmonary arteries. Evaluation of the more distal pulmonary arterial branches is degraded by respiratory motion artifact, particularly within the lung bases. Within this limitation, there is no evidence of pulmonary embolism to the lobar branch level. Thoracic aorta is nonaneurysmal. Moderate cardiomegaly. No pericardial effusion. Mediastinum/Nodes: Redemonstration of multiple mildly enlarged mediastinal lymph nodes. Reference nodes include 1.1 cm prevascular node (series 5, image 47), unchanged. Low right paratracheal node measuring approximately 1.2 cm (series 5, image 53), previously 1.4 cm. Low right hilar node is not well evaluated today given phase of contrast, but appears grossly stable when compared with the previous exam. No axillary lymphadenopathy. Thyroid gland, trachea, and esophagus demonstrate no significant findings. Lungs/Pleura: Bilateral perihilar and bibasilar ground-glass opacity with subtle areas of nodularity. Findings are most pronounced within the right middle lobe. Trace right pleural effusion. No pneumothorax. Upper Abdomen: Reflux of contrast into the IVC and hepatic veins. Eventration of the right hemidiaphragm. No acute  findings within the included upper abdomen. Musculoskeletal: No chest wall abnormality. No acute or significant osseous findings. Review of the MIP images confirms the above findings. IMPRESSION: 1. No evidence of pulmonary embolism to the lobar branch level. 2. Bilateral perihilar and bibasilar ground-glass opacity, most pronounced within the right middle lobe. Findings may represent pulmonary edema versus an infectious or inflammatory process. 3. Trace right pleural effusion. 4. Moderate cardiomegaly with reflux of contrast into the IVC and hepatic veins, suggesting right heart dysfunction. 5. Stable mediastinal lymphadenopathy, likely reactive. Electronically Signed   By: Duanne Guess D.O.   On: 02/03/2022 08:39   ? ?Cardiac Studies  ? ? ? ?Patient Profile  ?   ?41 y.o. male with a hx of NICM with LVEF 20-25% and HTN  who is being seen 02/03/2022 for the evaluation of acute on chronic systolic HF exacerbation  ? ?Assessment & Plan  ?  ?#Acute on Chronic Systolic Heart Failure: ?#Nonischemic Cardiomyopathy: ?Patient with recently diagnosed systolic heart failure in 09/2021 when he presented to OSH with worsening SOB and volume overload. Has been followed by Dr. Fransisco Beau. TTE 11/2021 with LVEF 20-25%, mild LV dilation, mild RV systolic dysfunction, mild MR. Cath with mild, nonobstructive disease. Has had issues with dietary and medication noncompliance and presents here with an acute HFrEF exacerbation in the setting of not taking his medications. He is motivated to try to improve his medication adherence to improve his symptoms, decrease the chance of rehospitalization and prolong his life.  ?-Appears volume overloaded on exam, good diuresis yesterday.  Continue lasix 40mg  IV BID ?-Maintain K greater than 4, mag greater than 2 ?-Continue coreg 12.5mg  BID ?-Stop lisinopril and plan transition to entresto, can start tomorrow once off lisinopril x36 hours ?-Started spironolactone 12.5mg  daily ?-Started farxiga 10mg   daily ?-Added hydralazine for now until able to start entresto ?-Planned for CMR as outpatient per primary cardiologist ?-Discussed importance of medication compliance at length; patient is very 

## 2022-02-05 DIAGNOSIS — I110 Hypertensive heart disease with heart failure: Secondary | ICD-10-CM | POA: Diagnosis not present

## 2022-02-05 DIAGNOSIS — Z87891 Personal history of nicotine dependence: Secondary | ICD-10-CM | POA: Diagnosis not present

## 2022-02-05 DIAGNOSIS — I10 Essential (primary) hypertension: Secondary | ICD-10-CM | POA: Diagnosis not present

## 2022-02-05 DIAGNOSIS — I5043 Acute on chronic combined systolic (congestive) and diastolic (congestive) heart failure: Secondary | ICD-10-CM | POA: Diagnosis not present

## 2022-02-05 DIAGNOSIS — I5020 Unspecified systolic (congestive) heart failure: Secondary | ICD-10-CM | POA: Diagnosis not present

## 2022-02-05 LAB — BASIC METABOLIC PANEL
Anion gap: 8 (ref 5–15)
BUN: 9 mg/dL (ref 6–20)
CO2: 26 mmol/L (ref 22–32)
Calcium: 8.8 mg/dL — ABNORMAL LOW (ref 8.9–10.3)
Chloride: 108 mmol/L (ref 98–111)
Creatinine, Ser: 1.06 mg/dL (ref 0.61–1.24)
GFR, Estimated: >60 mL/min (ref >60)
Glucose, Bld: 102 mg/dL — ABNORMAL HIGH (ref 70–99)
Potassium: 3.5 mmol/L (ref 3.5–5.1)
Sodium: 142 mmol/L (ref 135–145)

## 2022-02-05 LAB — MAGNESIUM: Magnesium: 1.9 mg/dL (ref 1.7–2.4)

## 2022-02-05 MED ORDER — POTASSIUM CHLORIDE CRYS ER 20 MEQ PO TBCR
40.0000 meq | EXTENDED_RELEASE_TABLET | Freq: Once | ORAL | Status: AC
  Administered 2022-02-05: 40 meq via ORAL
  Filled 2022-02-05: qty 2

## 2022-02-05 MED ORDER — MAGNESIUM SULFATE 2 GM/50ML IV SOLN
2.0000 g | Freq: Once | INTRAVENOUS | Status: AC
  Administered 2022-02-05: 2 g via INTRAVENOUS
  Filled 2022-02-05: qty 50

## 2022-02-05 NOTE — Progress Notes (Signed)
Notified by nursing that patient had 16 beat run of NSVT. Asymptomatic.  K 3.5, Mg 1.9. Repletion given.  Gwyndolyn Kaufman, MD

## 2022-02-05 NOTE — Progress Notes (Signed)
   Subjective:  Patient seen and assessed at bedside.  Reports doing well and diuresing well. Denies no acute problems. Reports dyspnea is improving.  No complaints or concerns.  Objective:  Vital signs in last 24 hours: Vitals:   02/04/22 1721 02/04/22 2030 02/05/22 0517 02/05/22 0614  BP: (!) 131/93 (!) 140/105 (!) 135/98 (!) 125/91  Pulse:  79 81 81  Resp: 20 20 (!) 22 20  Temp: 98.1 F (36.7 C) 98.5 F (36.9 C) 98.6 F (37 C) 98.3 F (36.8 C)  TempSrc: Oral Oral Oral Oral  SpO2: 100% 98% 100% 95%  Weight:   (!) 146 kg   Height:       General: NAD, nl appearance, lying comfortably in bed HE: Normocephalic, atraumatic, EOMI, Conjunctivae normal ENT: No congestion, no rhinorrhea, no exudate or erythema  Cardiovascular: Normal rate, regular rhythm. No murmurs, rubs, or gallops Pulmonary: Effort normal, breath sounds normal. No wheezes, rales, or rhonchi Abdominal: soft, nontender, bowel sounds present Musculoskeletal: no swelling, deformity, injury or tenderness in extremities. 1+ pitting edema to the shins Skin: Warm, dry, no bruising, erythema, or rash Psychiatric/Behavioral: normal mood, normal behavior      Assessment/Plan:  Principal Problem:   Heart failure (HCC) Active Problems:   HTN (hypertension)   Acute on chronic combined systolic and diastolic CHF (congestive heart failure) (HCC)  #Acute on Chronic Heart Failure with reduced ejection fraction (EF 20-25%) #Nonischemic cardiomyopathy Acute presentation in the setting of recent medication noncompliance. TTE 11/2021 with LVEF 20-25%, mild LV dilation, mild RV systolic dysfunction, mild MR. Cath with mild, nonobstructive disease.  Patient was started on IV Lasix 40 mg twice daily here and has responded well. Urine output of 5.6 L in the past 24 hours. His weight is currently 146 kg, down from 158 kg on admit. The patient states that his dry weight is around 154 kg, however he continues to appear volume overloaded  on exam.   -Cardiology is on board, appreciate their recommendations -Continue IV Lasix 40 mg twice daily -Continue Coreg 12.5 mg twice daily -Continue Entresto 24-26 mg BID -Continue Farxiga 10 mg daily -Continue spironolactone 12.5 mg daily -Strict Ins and outs  -Daily weights -Trend BMP -Maintain Mg>2, K>4 -Will need cardiac MRI as outpatient   #HTN Patient is on lisinopril and Coreg at home. Systolic blood pressures have been ranging in the 120s-140s most recently. -Continue home Coreg -Holding Lisinopril per cardiology, starting entresto today   Prior to Admission Living Arrangement: Home Anticipated Discharge Location: Home Barriers to Discharge: Medical stability Dispo: Anticipated discharge in approximately 1-2 day(s).   Andrey Campanile, MD 02/05/2022, 6:47 AM Pager: [phone]  After 5pm on weekdays and 1pm on weekends: On Call pager [phone]

## 2022-02-05 NOTE — Progress Notes (Signed)
Progress Note  Patient Name: Mitchell Ochoa Date of Encounter: 02/05/2022  [facility] Cardiologist: None   Subjective   Net -3.8 L yesterday, negative 10L on admission.  BP 120/92.  Stable creatinine (0.9 > 0.97>1.06).  Reports dyspnea improving  Inpatient Medications    Scheduled Meds:  carvedilol  12.5 mg Oral BID   dapagliflozin propanediol  10 mg Oral Daily   furosemide  40 mg Intravenous BID   potassium chloride  40 mEq Oral Once   rivaroxaban  10 mg Oral Daily   sacubitril-valsartan  1 tablet Oral BID   sodium chloride flush  3 mL Intravenous Q12H   spironolactone  12.5 mg Oral Daily   Continuous Infusions:  sodium chloride Stopped (02/03/22 1600)   PRN Meds: sodium chloride, acetaminophen, ondansetron (ZOFRAN) IV, sodium chloride flush   Vital Signs    Vitals:   02/04/22 2030 02/05/22 0517 02/05/22 0614 02/05/22 0714  BP: (!) 140/105 (!) 135/98 (!) 125/91 (!) 120/92  Pulse: 79 81 81 84  Resp: 20 (!) 22 20 19   Temp: 98.5 F (36.9 C) 98.6 F (37 C) 98.3 F (36.8 C) 98.1 F (36.7 C)  TempSrc: Oral Oral Oral Oral  SpO2: 98% 100% 95% 97%  Weight:  (!) 146 kg    Height:        Intake/Output Summary (Last 24 hours) at 02/05/2022 0843 Last data filed at 02/05/2022 0534 Gross per 24 hour  Intake 1857 ml  Output 5675 ml  Net -3818 ml       02/05/2022    5:17 AM 02/04/2022   12:21 AM 02/03/2022    9:30 AM  Last 3 Weights  Weight (lbs) 321 lb 14 oz 328 lb 7.8 oz 348 lb 5.2 oz  Weight (kg) 146 kg 149 kg 158 kg      Telemetry    Normal sinus rhythm, NSVT x4 beats- Personally Reviewed  ECG    No new ECG- Personally Reviewed  Physical Exam   GEN: No acute distress.   Neck: + JVD Cardiac: RRR, no murmurs, rubs, or gallops.  Respiratory: Clear to auscultation bilaterally. GI: Soft, nontender, non-distended  MS: 1+ edema Neuro:  Nonfocal  Psych: Normal affect   Labs    High Sensitivity Troponin:   Recent Labs  Lab 02/03/22 0410  02/03/22 0706  TROPONINIHS 31* 33*      Chemistry Recent Labs  Lab 02/03/22 0410 02/03/22 1306 02/04/22 0451 02/04/22 0642 02/04/22 1307 02/05/22 0408  NA 141 147* 143  --   --  142  K 3.8 3.4* 3.6  --  3.8 3.5  CL 109 109 105  --   --  108  CO2 23 28 32  --   --  26  GLUCOSE 117* 113* 108*  --   --  102*  BUN 10 9 9   --   --  9  CREATININE 0.94 0.90 0.97  --   --  1.06  CALCIUM 8.6* 8.6* 8.9  --   --  8.8*  MG  --  1.6* 1.9  --   --  1.9  PROT 5.5* 5.1* 5.3*  --   --   --   ALBUMIN 3.3* 3.1* 3.1*  --   --   --   AST 38 36 35  --   --   --   ALT 45* 43 42  --   --   --   ALKPHOS 40 34* 40  --   --   --  BILITOT 3.1* 3.1* 2.6* 2.5*  --   --   GFRNONAA >60 >60 >60  --   --  >60  ANIONGAP 9 10 6   --   --  8     Lipids No results for input(s): CHOL, TRIG, HDL, LABVLDL, LDLCALC, CHOLHDL in the last 168 hours.  Hematology Recent Labs  Lab 02/03/22 0410  WBC 4.7  RBC 5.51  HGB 17.0  HCT 49.2  MCV 89.3  MCH 30.9  MCHC 34.6  RDW 15.1  PLT 223    Thyroid No results for input(s): TSH, FREET4 in the last 168 hours.  BNP Recent Labs  Lab 02/03/22 0410  BNP 859.3*     DDimer No results for input(s): DDIMER in the last 168 hours.   Radiology    No results found.  Cardiac Studies     Patient Profile     41 y.o. male with a hx of NICM with LVEF 20-25% and HTN who is being seen 02/03/2022 for the evaluation of acute on chronic systolic HF exacerbation   Assessment & Plan    #Acute on Chronic Systolic Heart Failure: #Nonischemic Cardiomyopathy: Patient with recently diagnosed systolic heart failure in 09/2021 when he presented to OSH with worsening SOB and volume overload. Has been followed by Dr. 10/2021. TTE 11/2021 with LVEF 20-25%, mild LV dilation, mild RV systolic dysfunction, mild MR. Cath with mild, nonobstructive disease. Has had issues with dietary and medication noncompliance and presents here with an acute HFrEF exacerbation in the setting of not  taking his medications. He is motivated to try to improve his medication adherence to improve his symptoms, decrease the chance of rehospitalization and prolong his life.  -Appears volume overloaded on exam, good diuresis yesterday.  Continue lasix 40mg  IV BID -Maintain K greater than 4, mag greater than 2 -Continue coreg 12.5mg  BID -Stopped lisinopril, has been off for over 36 hours, will plan to start Entresto 24-26 mg twice daily today -Continue spironolactone 12.5mg  daily -Continue farxiga 10mg  daily -Plan for CMR as outpatient  -Discussed importance of medication compliance at length; patient is very motivated to try to improve adherence -Low Na diet    #HTN: Poorly controlled in the setting of medication noncompliance. Has been untreated for years and possibly contributing to vs cause of underlying cardiomyopathy. -Continue coreg 12.5mg  BID -Continue spironolactone 12.5mg  daily -Start Entresto 24-26 mg twice daily  #NSVT 16 beats this morning.  Maintain K >4, mag>2   For questions or updates, please contact [facility] Please consult [email or web address] for contact info under        Signed, 12/2021, MD  02/05/2022, 8:43 AM

## 2022-02-05 NOTE — Progress Notes (Addendum)
Was successful in obtaining a copay card for [facility].  This copay card will make the patients copay $10.  The billing information is as follows and has been shared with the patient  RxBin: [id number] PCN: [id number] Member ID: [id number] Group ID: [id number]  Entresto 30-day free information RxBin: [id number] PCN: [id number] Member ID: [id number] Group ID: [id number]  Was successful in obtaining a copay card for Farxiga.  This copay card will make the patients copay $0.  The billing information is as follows and has been shared with the patient  RxBin: [id number] PCN: CN Member ID: [id number] Group ID: [id number]  Farxiga 30-day free information  RxBin: [id number] PCN: [id number] Member ID: [id number] Group ID: [id number]   Filbert Schilder, PharmD PGY1 Pharmacy Resident 02/05/2022  1:23 PM

## 2022-02-05 NOTE — TOC Progression Note (Signed)
Transition of Care [facility]) - Progression Note    Patient Details  Name: Mitchell Ochoa MRN: [id number] Date of Birth: Jul 08, 1981  Transition of Care [facility]) CM/SW Contact  Zenon Mayo, RN Phone Number: 02/05/2022, 4:56 PM  Clinical Narrative:    from home,, CHF,  had run of 16 beats of vtach, conts on IV mag, IV lasix. TOC will continue to follow for dc needs.         Expected Discharge Plan and Services                                                 Social Determinants of Health (SDOH) Interventions Food Insecurity Interventions: Intervention Not Indicated Financial Strain Interventions: Intervention Not Indicated Housing Interventions: Intervention Not Indicated Physical Activity Interventions: Intervention Not Indicated Transportation Interventions: Intervention Not Indicated  Readmission Risk Interventions     View : No data to display.

## 2022-02-05 NOTE — Progress Notes (Signed)
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 History of dietary and medication noncompliance.  Presented to [facility] ED with dyspnea in the setting of dietary and medication noncompliance due to concern for side effects of medicines. CXR with cardiomegaly, vascular congestion and mild interstitial edema.  CTA negative for PE.  Most recent Echo 03/27 showed LVEF of 20-25%, mild biatrial dilation, mildly reduced RV function, and mild MR.  Started on IV diuresis.  Of note, patient is followed by the [facility] clinic. At 03/09 office visit, his lisinopril was increased to 20 mg twice daily and Toprol to 50 mg daily (HR 102) with Lasix QOD.  Patient reported having significant dyspnea after taking Toprol dose and presented to the ED with trace edema in lower extremities and hypertension.  Followed up in cardiology clinic with persistent trace LEE but reported improvement in dyspnea.  Spironolactone 25 mg daily added, Toprol switched to Coreg 6.25 mg BID, lisinopril reduced to 20 mg daily, furosemide continued at 20 mg daily.  Presented for planned [facility] 04/24 showing nonobstructive CAD.  Returned to cardiology clinic 04/26 where patient reported PND secondary to 3 days of diuretic noncompliance.  Symptoms improved after taking Lasix dose.  He was switched from lisinopril to Entresto 24-26 mg BID and Farxiga 5 mg daily was added.   Current HF Medications: Diuretic: furosemide IV 40 mg twice daily Beta Blocker: carvedilol 12.5 mg twice daily ACE/ARB/ARNI: Entresto 24-26 mg twice daily Aldosterone Antagonist: spironolactone 12.5 mg daily SGLT2i: Farxiga 10 mg daily  Prior to admission HF Medications: Diuretic: furosemide 20 mg daily Beta blocker: carvedilol 12.5 mg twice daily ACE/ARB/ARNI: lisinopril 20  mg twice daily  Pertinent Lab Values: Serum creatinine 1.06, BUN 9, Potassium 3.5, Sodium 142, BNP 869.3, Magnesium 1.9, A1c 3.9% (12/2021)  Vital Signs: Weight: 321 lbs (admission weight: 348 lbs) Blood pressure: 130s/90s Heart rate: 80s  I/O: -5.1 L yesterday; net -11.88 L  Medication Assistance / Insurance Benefits Check: Does the patient have prescription insurance?  Yes Type of insurance plan: Commercial   Outpatient Pharmacy:  Prior to admission outpatient pharmacy: [facility]  Is the patient willing to use [facility] TOC pharmacy at discharge? Yes Is the patient willing to transition their outpatient pharmacy to utilize a [facility] outpatient pharmacy?   Yes    Assessment: 1. Acute on chronic systolic CHF (LVEF 20-25%). NYHA class IV symptoms. - Continue IV diuresis with furosemide 40 mg twice daily - 7lb down, brisk uop of 3L so far today. Dyspnea improving. - Continue carvedilol 12.5 mg twice daily - Continue Entresto 24-26 mg twice daily - Continue spironolactone 12.5 mg daily - Continue Farxiga 10 mg daily   Plan: 1) Medication changes recommended at this time: - Give 2g IV Mg x1 - Give potassium 40 mEq x2 - Consider increasing Entresto to 49-51 mg twice daily tomorrow if BP allows  2) Patient assistance: Sherryll Burger copay $35 > $10 copay card - Farxiga $15 - copay card to bring down to $0  3)  Education  - Patient has been educated on current HF medications and potential additions to HF medication regimen - Patient verbalizes understanding that over the next few months, these medication doses may change and more medications may be added to optimize HF regimen - Patient has been educated  on basic disease state pathophysiology and goals of therapy   Filbert Schilder, PharmD PGY1 Pharmacy Resident 02/05/2022  9:07 AM

## 2022-02-06 DIAGNOSIS — I10 Essential (primary) hypertension: Secondary | ICD-10-CM | POA: Diagnosis not present

## 2022-02-06 DIAGNOSIS — I110 Hypertensive heart disease with heart failure: Secondary | ICD-10-CM | POA: Diagnosis not present

## 2022-02-06 DIAGNOSIS — I5043 Acute on chronic combined systolic (congestive) and diastolic (congestive) heart failure: Secondary | ICD-10-CM | POA: Diagnosis not present

## 2022-02-06 DIAGNOSIS — I5020 Unspecified systolic (congestive) heart failure: Secondary | ICD-10-CM | POA: Diagnosis not present

## 2022-02-06 DIAGNOSIS — Z87891 Personal history of nicotine dependence: Secondary | ICD-10-CM | POA: Diagnosis not present

## 2022-02-06 DIAGNOSIS — I4729 Other ventricular tachycardia: Secondary | ICD-10-CM | POA: Diagnosis not present

## 2022-02-06 LAB — MAGNESIUM: Magnesium: 1.9 mg/dL (ref 1.7–2.4)

## 2022-02-06 LAB — BASIC METABOLIC PANEL
Anion gap: 7 (ref 5–15)
BUN: 9 mg/dL (ref 6–20)
CO2: 27 mmol/L (ref 22–32)
Calcium: 8.9 mg/dL (ref 8.9–10.3)
Chloride: 108 mmol/L (ref 98–111)
Creatinine, Ser: 1.05 mg/dL (ref 0.61–1.24)
GFR, Estimated: >60 mL/min (ref >60)
Glucose, Bld: 105 mg/dL — ABNORMAL HIGH (ref 70–99)
Potassium: 4.2 mmol/L (ref 3.5–5.1)
Sodium: 142 mmol/L (ref 135–145)

## 2022-02-06 MED ORDER — FUROSEMIDE 40 MG PO TABS
40.0000 mg | ORAL_TABLET | Freq: Every day | ORAL | Status: DC
  Administered 2022-02-07: 40 mg via ORAL
  Filled 2022-02-06: qty 1

## 2022-02-06 MED ORDER — MAGNESIUM SULFATE 2 GM/50ML IV SOLN
2.0000 g | Freq: Once | INTRAVENOUS | Status: AC
  Administered 2022-02-06: 2 g via INTRAVENOUS
  Filled 2022-02-06: qty 50

## 2022-02-06 MED ORDER — ACETAZOLAMIDE SODIUM 500 MG IJ SOLR
500.0000 mg | Freq: Once | INTRAMUSCULAR | Status: AC
  Administered 2022-02-06: 500 mg via INTRAVENOUS
  Filled 2022-02-06: qty 500

## 2022-02-06 MED ORDER — STERILE WATER FOR INJECTION IJ SOLN
INTRAMUSCULAR | Status: AC
  Filled 2022-02-06: qty 10

## 2022-02-06 NOTE — Progress Notes (Signed)
Progress Note  Patient Name: Mitchell Ochoa Date of Encounter: 02/06/2022  [facility] Cardiologist: None   Subjective   Net -4.1 L yesterday, negative 14L on admission.  BP 128/98.  Stable creatinine (0.9 > 0.97>1.06>1.05).  Had dyspnea overnight but currently improved  Inpatient Medications    Scheduled Meds:  carvedilol  12.5 mg Oral BID   dapagliflozin propanediol  10 mg Oral Daily   furosemide  40 mg Intravenous BID   rivaroxaban  10 mg Oral Daily   sacubitril-valsartan  1 tablet Oral BID   sodium chloride flush  3 mL Intravenous Q12H   spironolactone  12.5 mg Oral Daily   Continuous Infusions:  sodium chloride Stopped (02/03/22 1600)   PRN Meds: sodium chloride, acetaminophen, ondansetron (ZOFRAN) IV, sodium chloride flush   Vital Signs    Vitals:   02/05/22 0924 02/05/22 2007 02/06/22 0408 02/06/22 0734  BP: 101/62 114/90 134/86 (!) 128/98  Pulse: 78 68 86 80  Resp:  20 19 19   Temp:  97.9 F (36.6 C) 98.7 F (37.1 C) 98.1 F (36.7 C)  TempSrc:  Oral Oral Oral  SpO2:  99% 96% 99%  Weight:      Height:        Intake/Output Summary (Last 24 hours) at 02/06/2022 0937 Last data filed at 02/06/2022 0827 Gross per 24 hour  Intake 960 ml  Output 3875 ml  Net -2915 ml       02/05/2022    5:17 AM 02/04/2022   12:21 AM 02/03/2022    9:30 AM  Last 3 Weights  Weight (lbs) 321 lb 14 oz 328 lb 7.8 oz 348 lb 5.2 oz  Weight (kg) 146 kg 149 kg 158 kg      Telemetry    Normal sinus rhythm, NSVT x15  beats- Personally Reviewed  ECG    No new ECG- Personally Reviewed  Physical Exam   GEN: No acute distress.   Neck: mild JVD Cardiac: RRR, no murmurs, rubs, or gallops.  Respiratory: Clear to auscultation bilaterally. GI: Soft, nontender, non-distended  MS: trace edema Neuro:  Nonfocal  Psych: Normal affect   Labs    High Sensitivity Troponin:   Recent Labs  Lab 02/03/22 0410 02/03/22 0706  TROPONINIHS 31* 33*      Chemistry Recent Labs   Lab 02/03/22 0410 02/03/22 0410 02/03/22 1306 02/04/22 0451 02/04/22 0642 02/04/22 1307 02/05/22 0408 02/06/22 0303  NA 141  --  147* 143  --   --  142 142  K 3.8  --  3.4* 3.6  --  3.8 3.5 4.2  CL 109  --  109 105  --   --  108 108  CO2 23  --  28 32  --   --  26 27  GLUCOSE 117*  --  113* 108*  --   --  102* 105*  BUN 10  --  9 9  --   --  9 9  CREATININE 0.94  --  0.90 0.97  --   --  1.06 1.05  CALCIUM 8.6*  --  8.6* 8.9  --   --  8.8* 8.9  MG  --    < > 1.6* 1.9  --   --  1.9 1.9  PROT 5.5*  --  5.1* 5.3*  --   --   --   --   ALBUMIN 3.3*  --  3.1* 3.1*  --   --   --   --  AST 38  --  36 35  --   --   --   --   ALT 45*  --  43 42  --   --   --   --   ALKPHOS 40  --  34* 40  --   --   --   --   BILITOT 3.1*  --  3.1* 2.6* 2.5*  --   --   --   GFRNONAA >60  --  >60 >60  --   --  >60 >60  ANIONGAP 9  --  10 6  --   --  8 7   < > = values in this interval not displayed.     Lipids No results for input(s): CHOL, TRIG, HDL, LABVLDL, LDLCALC, CHOLHDL in the last 168 hours.  Hematology Recent Labs  Lab 02/03/22 0410  WBC 4.7  RBC 5.51  HGB 17.0  HCT 49.2  MCV 89.3  MCH 30.9  MCHC 34.6  RDW 15.1  PLT 223    Thyroid No results for input(s): TSH, FREET4 in the last 168 hours.  BNP Recent Labs  Lab 02/03/22 0410  BNP 859.3*     DDimer No results for input(s): DDIMER in the last 168 hours.   Radiology    No results found.  Cardiac Studies     Patient Profile     41 y.o. male with a hx of NICM with LVEF 20-25% and HTN who is being seen 02/03/2022 for the evaluation of acute on chronic systolic HF exacerbation   Assessment & Plan    #Acute on Chronic combined heart Failure: #Nonischemic Cardiomyopathy: Patient with recently diagnosed systolic heart failure in 09/2021 when he presented to OSH with worsening SOB and volume overload. Has been followed by Dr. Fransisco Beau. TTE 11/2021 with LVEF 20-25%, mild LV dilation, mild RV systolic dysfunction, mild MR. Cath  with mild, nonobstructive disease. Has had issues with dietary and medication noncompliance and presents here with an acute HFrEF exacerbation in the setting of not taking his medications. He is motivated to try to improve his medication adherence to improve his symptoms, decrease the chance of rehospitalization and prolong his life.  -Volume status has improved significantly, likely approaching euvolemia.  Received IV Lasix this morning, will transition to p.o. Lasix 40 mg daily tomorrow -Maintain K greater than 4, mag greater than 2 -Continue coreg 12.5mg  BID -Continue Entresto 24-26 mg twice daily today -Continue spironolactone 12.5mg  daily -Continue farxiga 10mg  daily -Plan for CMR as outpatient  -Discussed importance of medication compliance at length; patient is very motivated to try to improve adherence -Low Na diet    #HTN: Poorly controlled in the setting of medication noncompliance. Has been untreated for years and possibly contributing to vs cause of underlying cardiomyopathy. -Continue coreg 12.5mg  BID -Continue spironolactone 12.5mg  daily -Continue Entresto 24-26 mg twice daily  #NSVT 15 beats this morning.  Maintain K >4, mag>2   For questions or updates, please contact [facility] Please consult [email or web address] for contact info under        Signed, , MD  02/06/2022, 9:37 AM

## 2022-02-06 NOTE — Progress Notes (Signed)
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 History of dietary and medication noncompliance.  Presented to [facility] ED with dyspnea in the setting of dietary and medication noncompliance due to concern for side effects of medicines. CXR with cardiomegaly, vascular congestion and mild interstitial edema.  CTA negative for PE.  Most recent Echo 03/27 showed LVEF of 20-25%, mild biatrial dilation, mildly reduced RV function, and mild MR.  Started on IV diuresis.   Of note, patient is followed by the [place] clinic. At 03/09 office visit, his lisinopril was increased to 20 mg twice daily and Toprol to 50 mg daily (HR 102) with Lasix QOD.  Patient reported having significant dyspnea after taking Toprol dose and presented to the ED with trace edema in lower extremities and hypertension.  Followed up in cardiology clinic with persistent trace LEE but reported improvement in dyspnea.  Spironolactone 25 mg daily added, Toprol switched to Coreg 6.25 mg BID, lisinopril reduced to 20 mg daily, furosemide continued at 20 mg daily.  Presented for planned [facility] 04/24 showing nonobstructive CAD.  Returned to cardiology clinic 04/26 where patient reported PND secondary to 3 days of diuretic noncompliance.  Symptoms improved after taking Lasix dose.  He was switched from lisinopril to Entresto 24-26 mg BID and Farxiga 5 mg daily was added.   Good response to IV diuresis.  Had some dyspnea overnight but reported improvement this morning.  Had a 15 beat run of NSVT, currently in NSR.    Current HF Medications: Diuretic: furosemide IV 40 mg twice daily Beta Blocker: carvedilol 12.5 mg twice daily ACE/ARB/ARNI: Entresto 24-26 mg twice daily Aldosterone Antagonist: spironolactone 12.5 mg daily SGLT2i: Farxiga 10 mg  daily  Prior to admission HF Medications: Diuretic: furosemide 20 mg daily Beta blocker: carvedilol 12.5 mg twice daily ACE/ARB/ARNI: lisinopril 20 mg twice daily  Pertinent Lab Values: Serum creatinine 1.05, BUN 9, Potassium 4.2, Sodium 142, BNP 869.3, Magnesium 1.9, A1c 3.9% (12/2021)  Vital Signs: Weight: 321 lbs (admission weight: 348 lbs) Blood pressure: 130s/90s Heart rate: 80s  I/O: -6 L yesterday; net -16.2 L  Medication Assistance / Insurance Benefits Check: Does the patient have prescription insurance?  Yes Type of insurance plan: Commercial   Outpatient Pharmacy:  Prior to admission outpatient pharmacy: [facility]  Is the patient willing to use [place] at discharge? Yes Is the patient willing to transition their outpatient pharmacy to utilize a [facility] outpatient pharmacy?   Yes    Assessment: 1. Acute on chronic systolic CHF (LVEF 0000000). NYHA class IV symptoms. - Continue IV diuresis with furosemide 40 mg twice daily - 7lb down, brisk uop of 3L so far today. Dyspnea improving, now only trace edema on exam. - Continue carvedilol 12.5 mg twice daily - Continue Entresto 24-26 mg twice daily - Continue spironolactone 12.5 mg daily - Continue Farxiga 10 mg daily   Plan: 1) Medication changes recommended at this time: - Give 2g IV Mg x1 - Consider increasing Entresto to 49-51 mg twice daily tomorrow if BP allows  2) Patient assistance: Delene Loll copay $35 > $10 copay card - Wilder Glade $15 - copay card to bring down to $0  3)  Education  - Patient has been educated on current HF medications and potential additions to HF medication regimen -  Patient verbalizes understanding that over the next few months, these medication doses may change and more medications may be added to optimize HF regimen - Patient has been educated on basic disease state pathophysiology and goals of therapy   Laurey Arrow, PharmD PGY1 Pharmacy Resident 02/06/2022  1:00 PM

## 2022-02-06 NOTE — Progress Notes (Signed)
   Subjective:  Patient was seen and assessed at bedside.  He states that he feels much better overall with diuresis. He does endorse 1-2 episodes overnight of lying flat and noticing his "heart slowing down," which did alarm him. He has no other complaints or concerns today.  Objective:  Vital signs in last 24 hours: Vitals:   02/05/22 0714 02/05/22 0924 02/05/22 2007 02/06/22 0408  BP: (!) 120/92 101/62 114/90 134/86  Pulse: 84 78 68 86  Resp: 19  20 19   Temp: 98.1 F (36.7 C)  97.9 F (36.6 C) 98.7 F (37.1 C)  TempSrc: Oral  Oral Oral  SpO2: 97%  99% 96%  Weight:      Height:       General: NAD, nl appearance, lying comfortably in bed HE: Normocephalic, atraumatic, EOMI, Conjunctivae normal ENT: No congestion, no rhinorrhea, no exudate or erythema  Cardiovascular: Normal rate, regular rhythm. No murmurs, rubs, or gallops Pulmonary: Effort normal, breath sounds normal. No wheezes, rales, or rhonchi Abdominal: soft, nontender, bowel sounds present Musculoskeletal: no swelling, deformity, injury or tenderness in extremities. Trace-1+ pitting edema to the shins Skin: Warm, dry, no bruising, erythema, or rash Psychiatric/Behavioral: normal mood, normal behavior      Assessment/Plan:  Principal Problem:   Heart failure (HCC) Active Problems:   HTN (hypertension)   Acute on chronic combined systolic and diastolic CHF (congestive heart failure) (HCC)  #Acute on Chronic Heart Failure with reduced ejection fraction (EF 20-25%) #Nonischemic cardiomyopathy Acute presentation in the setting of recent medication noncompliance. TTE 11/2021 with LVEF 20-25%, mild LV dilation, mild RV systolic dysfunction, mild MR. Cath with mild, nonobstructive disease.  Patient was started on IV Lasix 40 mg twice daily here and has responded well. Urine output of 5 L in the past 24 hours. His weight is currently 146 kg, down from 158 kg on admit. He continues to appear volume overloaded on exam,  however he seems to be approaching his dry weight.  He received IV Lasix earlier today, will transition to p.o. tomorrow.  -Cardiology is on board, appreciate their recommendations -We will transition to p.o. Lasix 40 mg daily tomorrow -Continue Coreg 12.5 mg twice daily -Continue Entresto 24-26 mg BID -Continue Farxiga 10 mg daily -Continue spironolactone 12.5 mg daily -Strict Ins and outs  -Daily weights -Trend BMP -Maintain Mg>2, K>4 -Will need cardiac MRI as outpatient   #HTN Patient is on lisinopril and Coreg at home. Systolic blood pressures have been ranging in the 110s-130s most recently. -Continue home Coreg -Holding Lisinopril per cardiology -Continue Entresto   Prior to Admission Living Arrangement: Home Anticipated Discharge Location: Home Barriers to Discharge: Medical stability Dispo: Anticipated discharge in approximately 1 day(s).   12/2021, MD 02/06/2022, 6:41 AM Pager: [phone]  After 5pm on weekdays and 1pm on weekends: On Call pager [phone]

## 2022-02-06 NOTE — Progress Notes (Signed)
Offered walking patient he declined wants to rest. Walks to bathroom.

## 2022-02-07 DIAGNOSIS — I4729 Other ventricular tachycardia: Secondary | ICD-10-CM | POA: Diagnosis not present

## 2022-02-07 DIAGNOSIS — I5043 Acute on chronic combined systolic (congestive) and diastolic (congestive) heart failure: Secondary | ICD-10-CM | POA: Diagnosis not present

## 2022-02-07 DIAGNOSIS — I10 Essential (primary) hypertension: Secondary | ICD-10-CM | POA: Diagnosis not present

## 2022-02-07 LAB — BASIC METABOLIC PANEL
Anion gap: 7 (ref 5–15)
BUN: 9 mg/dL (ref 6–20)
CO2: 25 mmol/L (ref 22–32)
Calcium: 8.8 mg/dL — ABNORMAL LOW (ref 8.9–10.3)
Chloride: 108 mmol/L (ref 98–111)
Creatinine, Ser: 0.94 mg/dL (ref 0.61–1.24)
GFR, Estimated: >60 mL/min (ref >60)
Glucose, Bld: 79 mg/dL (ref 70–99)
Potassium: 4 mmol/L (ref 3.5–5.1)
Sodium: 140 mmol/L (ref 135–145)

## 2022-02-07 LAB — HEPATIC FUNCTION PANEL
ALT: 41 U/L (ref 0–44)
AST: 33 U/L (ref 15–41)
Albumin: 3.2 g/dL — ABNORMAL LOW (ref 3.5–5.0)
Alkaline Phosphatase: 48 U/L (ref 38–126)
Bilirubin, Direct: 0.4 mg/dL — ABNORMAL HIGH (ref 0.0–0.2)
Indirect Bilirubin: 1.1 mg/dL — ABNORMAL HIGH (ref 0.3–0.9)
Total Bilirubin: 1.5 mg/dL — ABNORMAL HIGH (ref 0.3–1.2)
Total Protein: 5.5 g/dL — ABNORMAL LOW (ref 6.5–8.1)

## 2022-02-07 LAB — MAGNESIUM: Magnesium: 2 mg/dL (ref 1.7–2.4)

## 2022-02-07 MED ORDER — SPIRONOLACTONE 25 MG PO TABS: 12.5000 mg | ORAL_TABLET | Freq: Every day | ORAL | 1 refills | Status: DC

## 2022-02-07 MED ORDER — DAPAGLIFLOZIN PROPANEDIOL 5 MG PO TABS: 10.0000 mg | ORAL_TABLET | Freq: Every day | ORAL | 5 refills | Status: DC

## 2022-02-07 MED ORDER — SACUBITRIL-VALSARTAN 24-26 MG PO TABS: 1.0000 | ORAL_TABLET | Freq: Two times a day (BID) | ORAL | 5 refills | Status: DC

## 2022-02-07 MED ORDER — FUROSEMIDE 20 MG PO TABS: 40.0000 mg | ORAL_TABLET | Freq: Every morning | ORAL | 0 refills | Status: DC

## 2022-02-07 NOTE — Discharge Instructions (Signed)
Pleasure meeting you.   It will be important for you to take all of your medications as prescribed. If you have any questions or concerns please contact your primary care doctor or your cardiologists doctor immediately to get these addressed. There are resources attached to help you better understand your heart failure diagnosis.   Your medication lisinopril was stopped as you are now on ENTRESTO. Please continue the entresto.   If you feel you are gaining more weight or more short of breath please call your doctors office.

## 2022-02-07 NOTE — Progress Notes (Signed)
Progress Note  Patient Name: Mitchell Ochoa Date of Encounter: 02/07/2022  [facility] Cardiologist: None   Subjective   Net -2.4 L yesterday, negative 17L on admission.  BP 113/74.  Stable creatinine (0.9 > 0.97>1.06>1.05>0.94).  Weight is down 35 pounds from admission.  Denies any dyspnea or chest pain.  Inpatient Medications    Scheduled Meds:  carvedilol  12.5 mg Oral BID   dapagliflozin propanediol  10 mg Oral Daily   furosemide  40 mg Oral Daily   rivaroxaban  10 mg Oral Daily   sacubitril-valsartan  1 tablet Oral BID   sodium chloride flush  3 mL Intravenous Q12H   spironolactone  12.5 mg Oral Daily   Continuous Infusions:  sodium chloride Stopped (02/03/22 1600)   PRN Meds: sodium chloride, acetaminophen, ondansetron (ZOFRAN) IV, sodium chloride flush   Vital Signs    Vitals:   02/06/22 1945 02/07/22 0333 02/07/22 0807 02/07/22 0809  BP: (!) 138/118 110/86  113/74  Pulse: 87 84  78  Resp:  19 19 19   Temp: 98.3 F (36.8 C) (!) 97.5 F (36.4 C) 98.1 F (36.7 C) 98.1 F (36.7 C)  TempSrc: Oral Oral Oral Oral  SpO2: 100% 100%  96%  Weight:  (!) 142.3 kg    Height:        Intake/Output Summary (Last 24 hours) at 02/07/2022 0815 Last data filed at 02/07/2022 [id number] Gross per 24 hour  Intake 1257 ml  Output 3775 ml  Net -2518 ml       02/07/2022    3:33 AM 02/05/2022    5:17 AM 02/04/2022   12:21 AM  Last 3 Weights  Weight (lbs) 313 lb 11.2 oz 321 lb 14 oz 328 lb 7.8 oz  Weight (kg) 142.293 kg 146 kg 149 kg      Telemetry    Normal sinus rhythm,- Personally Reviewed  ECG    No new ECG- Personally Reviewed  Physical Exam   GEN: No acute distress.   Neck: No JVD Cardiac: RRR, no murmurs, rubs, or gallops.  Respiratory: Clear to auscultation bilaterally. GI: Soft, nontender, non-distended  MS: trace edema Neuro:  Nonfocal  Psych: Normal affect   Labs    High Sensitivity Troponin:   Recent Labs  Lab 02/03/22 0410 02/03/22 0706   TROPONINIHS 31* 33*      Chemistry Recent Labs  Lab 02/03/22 0410 02/03/22 0410 02/03/22 1306 02/04/22 0451 02/04/22 0642 02/04/22 1307 02/05/22 0408 02/06/22 0303 02/07/22 0326  NA 141  --  147* 143  --   --  142 142 140  K 3.8  --  3.4* 3.6  --    < > 3.5 4.2 4.0  CL 109  --  109 105  --   --  108 108 108  CO2 23  --  28 32  --   --  26 27 25   GLUCOSE 117*  --  113* 108*  --   --  102* 105* 79  BUN 10  --  9 9  --   --  9 9 9   CREATININE 0.94  --  0.90 0.97  --   --  1.06 1.05 0.94  CALCIUM 8.6*  --  8.6* 8.9  --   --  8.8* 8.9 8.8*  MG  --    < > 1.6* 1.9  --   --  1.9 1.9 2.0  PROT 5.5*  --  5.1* 5.3*  --   --   --   --   --  ALBUMIN 3.3*  --  3.1* 3.1*  --   --   --   --   --   AST 38  --  36 35  --   --   --   --   --   ALT 45*  --  43 42  --   --   --   --   --   ALKPHOS 40  --  34* 40  --   --   --   --   --   BILITOT 3.1*  --  3.1* 2.6* 2.5*  --   --   --   --   GFRNONAA >60  --  >60 >60  --   --  >60 >60 >60  ANIONGAP 9  --  10 6  --   --  8 7 7    < > = values in this interval not displayed.     Lipids No results for input(s): CHOL, TRIG, HDL, LABVLDL, LDLCALC, CHOLHDL in the last 168 hours.  Hematology Recent Labs  Lab 02/03/22 0410  WBC 4.7  RBC 5.51  HGB 17.0  HCT 49.2  MCV 89.3  MCH 30.9  MCHC 34.6  RDW 15.1  PLT 223    Thyroid No results for input(s): TSH, FREET4 in the last 168 hours.  BNP Recent Labs  Lab 02/03/22 0410  BNP 859.3*     DDimer No results for input(s): DDIMER in the last 168 hours.   Radiology    No results found.  Cardiac Studies     Patient Profile     41 y.o. male with a hx of NICM with LVEF 20-25% and HTN who is being seen 02/03/2022 for the evaluation of acute on chronic systolic HF exacerbation   Assessment & Plan    #Acute on Chronic combined heart Failure: #Nonischemic Cardiomyopathy: Patient with recently diagnosed systolic heart failure in 09/2021 when he presented to OSH with worsening SOB and  volume overload. Has been followed by Dr. Flossie Dibble. TTE 11/2021 with LVEF 20-25%, mild LV dilation, mild RV systolic dysfunction, mild MR. Cath with mild, nonobstructive disease. Has had issues with dietary and medication noncompliance and presents here with an acute HFrEF exacerbation in the setting of not taking his medications. He is motivated to try to improve his medication adherence to improve his symptoms, decrease the chance of rehospitalization and prolong his life.  -Volume status has improved significantly, transition to p.o. Lasix 40 mg daily today -Maintain K greater than 4, mag greater than 2 -Continue coreg 12.5mg  BID -Continue Entresto 24-26 mg twice daily  -Continue spironolactone 12.5mg  daily -Continue farxiga 10mg  daily -Plan for CMR as outpatient    #HTN: Poorly controlled in the setting of medication noncompliance. Has been untreated for years and possibly contributing to vs cause of underlying cardiomyopathy. -Continue coreg 12.5mg  BID -Continue spironolactone 12.5mg  daily -Continue Entresto 24-26 mg twice daily  #NSVT Maintain K >4, mag>2   [facility] will sign off.   Medication Recommendations: Carvedilol 12.5 mg twice daily, Farxiga 10 mg daily, Lasix 40 mg daily, Entresto 24-26 mg twice daily, spironolactone 12.5 mg daily Other recommendations (labs, testing, etc): BMET within 1 week Follow up as an outpatient: Scheduled for 5/30   For questions or updates, please contact [place] Please consult [email or web address] for contact info under        Signed, Donato Heinz, MD  02/07/2022, 8:15 AM

## 2022-02-07 NOTE — Discharge Summary (Signed)
Name: Mitchell Ochoa MRN: [id number] DOB: 1981/02/13 41 y.o. PCP: Myrtie Neither, PA-C  Date of Admission: 02/03/2022  3:47 AM Date of Discharge: No discharge date for patient encounter. Attending Physician: Campbell Riches, MD  Discharge Diagnosis: 1. Acute on chronic systolic and diastolic heart failure  2. Hypertension  3. Isolated hyperbilirubinemia   Discharge Medications: Allergies as of 02/07/2022       Reactions   Metoprolol Tartrate Other (See Comments)   tachycardia        Medication List     STOP taking these medications    FOLIC ACID PO   lisinopril 20 MG tablet Commonly known as: ZESTRIL       TAKE these medications    albuterol 108 (90 Base) MCG/ACT inhaler Commonly known as: VENTOLIN HFA Inhale 1-2 puffs into the lungs every 6 (six) hours as needed for wheezing or shortness of breath.   budesonide-formoterol 160-4.5 MCG/ACT inhaler Commonly known as: Symbicort Inhale 2 puffs into the lungs in the morning and at bedtime. What changed:  when to take this reasons to take this   carvedilol 12.5 MG tablet Commonly known as: COREG Take 12.5 mg by mouth 2 (two) times daily.   dapagliflozin propanediol 5 MG Tabs tablet Commonly known as: FARXIGA Take 2 tablets (10 mg total) by mouth daily. What changed: how much to take   furosemide 20 MG tablet Commonly known as: LASIX Take 2 tablets (40 mg total) by mouth every morning. What changed: how much to take   GARLIC PO Take 1 capsule by mouth daily.   levofloxacin 750 MG tablet Commonly known as: LEVAQUIN Take 1 tablet (750 mg total) by mouth daily.   MAGNESIUM PO Take 1 tablet by mouth daily.   Multivitamin Adult Tabs Take 1 tablet by mouth in the morning.   sacubitril-valsartan 24-26 MG Commonly known as: ENTRESTO Take 1 tablet by mouth 2 (two) times daily.   spironolactone 25 MG tablet Commonly known as: ALDACTONE Take 0.5 tablets (12.5 mg total) by mouth daily. What  changed:  when to take this reasons to take this   VITAMIN D-3 PO Take 1 capsule by mouth daily.        Disposition and follow-up:   Mr.Mitchell Ochoa was discharged from [facility] in Good condition.  At the hospital follow up visit please address:  1.  Heart failure medicaitons  2.  Labs / imaging needed at time of follow-up: Potassium and magnesium, hepatic function panel   3.  Pending labs/ test needing follow-up: Hepatic function panel   Follow-up Appointments:  Follow-up Information     [facility]. Go in 10 day(s).   Specialty: Cardiology Why: Hospital follow up PLEASE bring list of current medications FREE valet parking, Entrance C, [address] [place] information: [address] [id number] [place] Follow up on 02/19/2022.   Specialty: Internal Medicine Why: 9:20 for hospital follow up and establish new PCP Contact information: [place] [id number] [place] Hospital Course by problem list: 1. 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 Acute presentation in the setting of recent medication nonadherence. TTE 11/2021 with LVEF 20-25%,  mild LV dilation, mild RV systolic dysfunction, mild MR. Left heart catheterization with mild, nonobstructive coronary artery disease. Cta chest was also obtained which showed no evidence of pulmonary embolism. Patient was diuresed with IV lasix 40mg  BID with great response and by the time of discharge patient had no orthopnea, or lower extremity edema. He was transitioned to oral lasix 40mg  daily. In addition to his lasix, patient was transitioned back to his entresto, spironolactone, farxiga, and carvedilol.   He has  appointments scheduled with both his PCP and cardiologist.   2. Hypertension His hypertension was well controlled on the agents above.   3. Isolated hyperbilirubinemia  Patient noted to have a total bilirubin on admission labs of 3.1. The rest of his LFTs were within normal limits. Fractionation of the bilirubin showed predominant indirect hyperbilirubinemia. He had no evidence of hemolysis. Patient most likely with elevated bilirubin in the setting of his his heart failure.  -Patient will get this checked in the outpatient setting to ensure it is improved.   Discharge Exam:   BP 113/74 (BP Location: Left Wrist)   Pulse 78   Temp 98.1 F (36.7 C) (Oral)   Resp 19   Ht 6\' 2"  (1.88 m)   Wt (!) 142.3 kg   SpO2 96%   BMI 40.28 kg/m  Discharge exam:   Constitutional: Well-developed, well-nourished, and in no distress.  HENT:  Head: Normocephalic and atraumatic.  Eyes: EOM are normal.  Neck: Normal range of motion.  Cardiovascular: Normal rate, regular rhythm, intact distal pulses. No gallop and no friction rub.  No murmur heard. No lower extremity edema  Pulmonary: Non labored breathing on room air, no wheezing or rales  Abdominal: Soft. Normal bowel sounds. Non distended and non tender Musculoskeletal: Normal range of motion.        General: No tenderness or edema.  Neurological: Alert and oriented to person, place, and time. Non focal  Skin: Skin is warm and dry.    Pertinent Labs, Studies, and Procedures:     Latest Ref Rng & Units 02/07/2022    3:26 AM 02/06/2022    3:03 AM 02/05/2022    4:08 AM  BMP  Glucose 70 - 99 mg/dL 79   105   102    BUN 6 - 20 mg/dL 9   9   9     Creatinine 0.61 - 1.24 mg/dL 0.94   1.05   1.06    Sodium 135 - 145 mmol/L 140   142   142    Potassium 3.5 - 5.1 mmol/L 4.0   4.2   3.5    Chloride 98 - 111 mmol/L 108   108   108    CO2 22 - 32 mmol/L 25   27   26     Calcium 8.9 - 10.3 mg/dL 8.8   8.9   8.8     Component Ref Range & Units 3 d  ago (02/04/22) 3 d ago (02/04/22) 4 d ago (02/03/22) 4 d ago (02/03/22) 3 mo ago (10/14/21) 2 yr ago (11/04/19)  Total Bilirubin 0.3 - 1.2 mg/dL 2.5 High   2.6 High   3.1 High   3.1 High   0.9  0.9   Bilirubin, Direct 0.0 - 0.2 mg/dL 0.5 High         Indirect Bilirubin 0.3 - 0.9 mg/dL 2.0 High              Signed: Rick Duff, MD 02/07/2022, 10:58 AM

## 2022-02-07 NOTE — Care Management (Signed)
Patient provided with 30 day and copay reduction card for [facility]

## 2022-02-16 ENCOUNTER — Encounter (HOSPITAL_COMMUNITY): Payer: BC Managed Care – PPO

## 2022-02-17 ENCOUNTER — Encounter (HOSPITAL_COMMUNITY): Payer: Self-pay

## 2022-02-17 ENCOUNTER — Ambulatory Visit (HOSPITAL_COMMUNITY)
Admit: 2022-02-17 | Discharge: 2022-02-17 | Disposition: A | Discharge status: Home / Self Care | Payer: BC Managed Care – PPO | Attending: Cardiology | Admitting: Cardiology

## 2022-02-17 VITALS — BP 152/112 | HR 104 | Ht 74.0 in | Wt 323.6 lb

## 2022-02-17 DIAGNOSIS — Z79899 Other long term (current) drug therapy: Secondary | ICD-10-CM | POA: Insufficient documentation

## 2022-02-17 DIAGNOSIS — E785 Hyperlipidemia, unspecified: Secondary | ICD-10-CM | POA: Diagnosis not present

## 2022-02-17 DIAGNOSIS — I2510 Atherosclerotic heart disease of native coronary artery without angina pectoris: Secondary | ICD-10-CM | POA: Insufficient documentation

## 2022-02-17 DIAGNOSIS — Z7982 Long term (current) use of aspirin: Secondary | ICD-10-CM | POA: Diagnosis not present

## 2022-02-17 DIAGNOSIS — I5022 Chronic systolic (congestive) heart failure: Secondary | ICD-10-CM | POA: Diagnosis present

## 2022-02-17 DIAGNOSIS — R0683 Snoring: Secondary | ICD-10-CM | POA: Insufficient documentation

## 2022-02-17 DIAGNOSIS — Z7984 Long term (current) use of oral hypoglycemic drugs: Secondary | ICD-10-CM | POA: Insufficient documentation

## 2022-02-17 DIAGNOSIS — I428 Other cardiomyopathies: Secondary | ICD-10-CM | POA: Diagnosis not present

## 2022-02-17 DIAGNOSIS — Z7902 Long term (current) use of antithrombotics/antiplatelets: Secondary | ICD-10-CM | POA: Diagnosis not present

## 2022-02-17 DIAGNOSIS — I10 Essential (primary) hypertension: Secondary | ICD-10-CM | POA: Diagnosis not present

## 2022-02-17 DIAGNOSIS — I110 Hypertensive heart disease with heart failure: Secondary | ICD-10-CM | POA: Insufficient documentation

## 2022-02-17 LAB — BRAIN NATRIURETIC PEPTIDE: B Natriuretic Peptide: 901 pg/mL — ABNORMAL HIGH (ref 0.0–100.0)

## 2022-02-17 LAB — BASIC METABOLIC PANEL
Anion gap: 7 (ref 5–15)
BUN: 10 mg/dL (ref 6–20)
CO2: 28 mmol/L (ref 22–32)
Calcium: 9.3 mg/dL (ref 8.9–10.3)
Chloride: 106 mmol/L (ref 98–111)
Creatinine, Ser: 0.73 mg/dL (ref 0.61–1.24)
GFR, Estimated: >60 mL/min (ref >60)
Glucose, Bld: 102 mg/dL — ABNORMAL HIGH (ref 70–99)
Potassium: 3.7 mmol/L (ref 3.5–5.1)
Sodium: 141 mmol/L (ref 135–145)

## 2022-02-17 MED ORDER — FUROSEMIDE 20 MG PO TABS: 40.0000 mg | ORAL_TABLET | ORAL | 3 refills | Status: DC

## 2022-02-17 MED ORDER — CARVEDILOL 12.5 MG PO TABS: 12.5000 mg | ORAL_TABLET | Freq: Two times a day (BID) | ORAL | 3 refills | Status: DC

## 2022-02-17 MED ORDER — ATORVASTATIN CALCIUM 20 MG PO TABS: 20.0000 mg | ORAL_TABLET | Freq: Every day | ORAL | 3 refills | Status: DC

## 2022-02-17 MED ORDER — ENTRESTO 49-51 MG PO TABS: 1.0000 | ORAL_TABLET | Freq: Two times a day (BID) | ORAL | 6 refills | Status: DC

## 2022-02-17 NOTE — Patient Instructions (Signed)
INCREASE Entresto to 49/51 mg, one tab twice a day RESTART Coreg 12.5,one tab twice a day DECREASE Lasix to 40 mg, every other day START Atorvastatin 20 mg, one tab daily   Labs today We will only contact you if something comes back abnormal or we need to make some changes. Otherwise no news is good news!  Your physician recommends that you schedule a follow-up appointment in: 2-3 weeks  in the Advanced Practitioners (PA/NP) Clinic and in 12 weeks with Dr Haroldine Laws  Do the following things EVERYDAY: Weigh yourself in the morning before breakfast. Write it down and keep it in a log. Take your medicines as prescribed Eat low salt foods--Limit salt (sodium) to 2000 mg per day.  Stay as active as you can everyday Limit all fluids for the day to less than 2 liters   At the [facility], you and your health needs are our priority. As part of our continuing mission to provide you with exceptional heart care, we have created designated Provider Care Teams. These Care Teams include your primary Cardiologist (physician) and Advanced Practice Providers (APPs- Physician Assistants and Nurse Practitioners) who all work together to provide you with the care you need, when you need it.   You may see any of the following providers on your designated Care Team at your next follow up: Dr Glori Bickers Dr Haynes Kerns, NP Lyda Jester, [place] [facility], [place] Audry Riles, PharmD   Please be sure to bring in all your medications bottles to every appointment.

## 2022-02-17 NOTE — Progress Notes (Signed)
HEART & VASCULAR TRANSITION OF CARE CONSULT NOTE     Referring Physician: Dr. Bjorn Pippin  Primary Care: Modesta Messing  Primary Cardiologist: [facility]   HPI: Referred to clinic by Dr. Bjorn Pippin, cardiology, for heart failure consultation.   41 y/o AAM w/ prior h/o uncontrolled/ untreated HTN and newly diagnosed systolic heart failure. Diagnosed w/ HR 3/23, in setting of uncontrolled HTN. Evaluated at [facility]. Echo 3/23 EF 20-25%, RV mildly reduced. Underwent LHC 4/23 demonstrating mild nonobstructive CAD c/w NICM. Felt to be hypertensive CM w/ probable OSA. Placed on GDMT and recommended for outpatient sleep study. Of note at [facility], LDL 93 mg/dL. Hgb A1c 4.9  Recently hospitalization at [facility] 5/23 for a/c CHF in setting of dietary indiscretion and poor compliance w/ meds. Noted to be hypertensive on admit, 160s/110s. Chest CT negative for PE but showed bilateral perihilar and bibasilar ground-glass opacity, most pronounced within the right middle lobe. Findings may represent pulmonary edema versus an infectious or inflammatory process.  Diuresed w/ IV Lasix and placed back on antihypertensives/GDMT. Diuresed 17L. D/c wt 312 lb. Outpatient cMRI recommended. Referred to [facility] clinic.   Presents today for assessment. Here w/ his mother. Reports improved symptoms. Breathing/ functional capacity much improved, NYHA Class I-III. Denies orthopnea/PND. No LEE. Wt today elevated at 323 lb. No LEE. Reports full med compliance but ran out of Coreg. Has Farxiga at home but hasn't starting taking yet. Taking all other meds. BP elevated 152/112. No side effects w/ meds. Reports h/o snoring. Denies tobacco/ ETOH. Believes he may have had COVID early 2020.   He would like to switch cardiac care to [facility].     Cardiac Testing    Echocardiogram 12/15/2021 ([facility])   Left Ventricle: Left ventricle is mildly dilated.  Systolic function is severely abnormal. EF: 20-25%.   Left Atrium: Left  atrium is moderately dilated.   Right Ventricle: Right ventricle is mildly dilated. Systolic function is mildly reduced.   Aortic Valve: There is no regurgitation or stenosis.   Mitral Valve: There is mild regurgitation with a centrally directed jet.   Tricuspid Valve: The right ventricular systolic pressure is mildly elevated (37-49 mmHg).   No prior study   LHC 01/12/22 ([facility])  Hemodynamics 1. Central Aortic Pressure -147/103 mmHg 2. Left Ventricular Pressure-155/25 mmHg Additional comments on on hemodynamics: LVEDP 35 mmHg.   Coronary Angiography Antatomically right dominant No visible fluoroscopic coronary calcification  1. Left Main -large, normal.  Bifurcates distally. 2. Left anterior descending artery -large vessel proximally, moderate  vessel mid to distally.  Mild luminal irregularities primarily distally. 3. Diagonals -3 diagonal branches.  First diagonal branch is moderate in  size has minor irregularities.  Second and third diagonal  branches are  small with minor irregularities 4. Left Circumflex -anatomically nondominant.  Moderately large vessel.   20 to 30% eccentric stenosis in the proximal segment then minor diffuse  irregularities. 5. Obtuse Marginals -first marginal branch is small tortuous with minor  irregularities.  Second marginal branch is also tortuous with minor  irregularities. 6. Right Coronary Artery -anatomically dominant.  Moderately large vessel.  Generally smooth in contour.  No focal angiographic or obstructive  stenosis.  Minor irregularities primarily distally.  The distal right  coronary continues as the posterior descending artery which is small and  has diffuse irregularities. 7. Posterior Descending Artery -small-diffuse irregularities.  Left Ventriculography -not performed.  CONCLUSIONS:  Minor nonobstructive coronary artery plaque Nonischemic cardiomyopathy with severely reduced left ventricular  ejection  fraction Elevated left  ventricular end-diastolic filling pressures. Acute on chronic systolic and diastolic congestive heart failure   Review of Systems: [y] = yes, [ ]  = no   General: Weight gain [ ] ; Weight loss [ ] ; Anorexia [ ] ; Fatigue [ ] ; Fever [ ] ; Chills [ ] ; Weakness [ ]   Cardiac: Chest pain/pressure [ ] ; Resting SOB [ ] ; Exertional SOB [ ] ; Orthopnea [ ] ; Pedal Edema [ ] ; Palpitations [ ] ; Syncope [ ] ; Presyncope [ ] ; Paroxysmal nocturnal dyspnea[ ]   Pulmonary: Cough [ ] ; Wheezing[ ] ; Hemoptysis[ ] ; Sputum [ ] ; Snoring [ Y]  GI: Vomiting[ ] ; Dysphagia[ ] ; Melena[ ] ; Hematochezia [ ] ; Heartburn[ ] ; Abdominal pain [ ] ; Constipation [ ] ; Diarrhea [ ] ; BRBPR [ ]   GU: Hematuria[ ] ; Dysuria [ ] ; Nocturia[ ]   Vascular: Pain in legs with walking [ ] ; Pain in feet with lying flat [ ] ; Non-healing sores [ ] ; Stroke [ ] ; TIA [ ] ; Slurred speech [ ] ;  Neuro: Headaches[ ] ; Vertigo[ ] ; Seizures[ ] ; Paresthesias[ ] ;Blurred vision [ ] ; Diplopia [ ] ; Vision changes [ ]   Ortho/Skin: Arthritis [ ] ; Joint pain [ ] ; Muscle pain [ ] ; Joint swelling [ ] ; Back Pain [ ] ; Rash [ ]   Psych: Depression[ ] ; Anxiety[ ]   Heme: Bleeding problems [ ] ; Clotting disorders [ ] ; Anemia [ ]   Endocrine: Diabetes [ ] ; Thyroid dysfunction[ ]    Past Medical History:  Diagnosis Date   CAD (coronary artery disease) 12/2021   mild nonobstructive disease by cath ([facility])   CHF (congestive heart failure) ([facility])    Hypertension     Current Outpatient Medications  Medication Sig Dispense Refill   albuterol (VENTOLIN HFA) 108 (90 Base) MCG/ACT inhaler Inhale 1-2 puffs into the lungs every 6 (six) hours as needed for wheezing or shortness of breath. 1 each 2   budesonide-formoterol (SYMBICORT) 160-4.5 MCG/ACT inhaler Inhale 2 puffs into the lungs in the morning and at bedtime. (Patient taking differently: Inhale 2 puffs into the lungs daily as needed (Shortness of breath).) 10.2 g 5   carvedilol (COREG) 12.5 MG tablet Take 12.5 mg by mouth 2 (two)  times daily.     Cholecalciferol (VITAMIN D-3 PO) Take 1 capsule by mouth daily.     dapagliflozin propanediol (FARXIGA) 5 MG TABS tablet Take 2 tablets (10 mg total) by mouth daily. 30 tablet 5   furosemide (LASIX) 20 MG tablet Take 2 tablets (40 mg total) by mouth every morning. 30 tablet 0   GARLIC PO Take 1 capsule by mouth daily.     levofloxacin (LEVAQUIN) 750 MG tablet Take 1 tablet (750 mg total) by mouth daily. 14 tablet 0   MAGNESIUM PO Take 1 tablet by mouth daily.     Multiple Vitamin (MULTIVITAMIN ADULT) TABS Take 1 tablet by mouth in the morning.     sacubitril-valsartan (ENTRESTO) 24-26 MG Take 1 tablet by mouth 2 (two) times daily. 60 tablet 5   spironolactone (ALDACTONE) 25 MG tablet Take 0.5 tablets (12.5 mg total) by mouth daily. 30 tablet 1   No current facility-administered medications for this encounter.    Allergies  Allergen Reactions   Metoprolol Tartrate Other (See Comments)    tachycardia      Social History   Socioeconomic History   Marital status: Single    Spouse name: Not on file   Number of children: 3   Years of education: Not on file   Highest education level: High school  graduate  Occupational History   Not on file  Tobacco Use   Smoking status: Former    Years: 20.00    Types: Cigarettes    Quit date: 09/30/2021    Years since quitting: 0.3   Smokeless tobacco: Never  Vaping Use   Vaping Use: Never used  Substance and Sexual Activity   Alcohol use: Not Currently    Comment: socially   Drug use: Not Currently    Types: Marijuana    Comment: quit 09/29/21   Sexual activity: Not Currently  Other Topics Concern   Not on file  Social History Narrative   Not on file   Social Determinants of Health   Financial Resource Strain: Low Risk    Difficulty of Paying Living Expenses: Not very hard  Food Insecurity: No Food Insecurity   Worried About Charity fundraiser in the Last Year: Never true   Ran Out of Food in the Last Year: Never  true  Transportation Needs: No Transportation Needs   Lack of Transportation (Medical): No   Lack of Transportation (Non-Medical): No  Physical Activity: Insufficiently Active   Days of Exercise per Week: 4 days   Minutes of Exercise per Session: 20 min  Stress: Not on file  Social Connections: Not on file  Intimate Partner Violence: Not on file      Family History  Problem Relation Age of Onset   Hypertension Mother    Hypertension Father     Vitals:   02/17/22 1218  BP: (!) 152/112  Pulse: (!) 104  Weight: (!) 146.8 kg (323 lb 9.6 oz)  Height: 6\' 2"  (1.88 m)    PHYSICAL EXAM: General:  Well appearing, moderately obese. No respiratory difficulty HEENT: normal Neck: supple. no JVD. Carotids 2+ bilat; no bruits. No lymphadenopathy or thryomegaly appreciated. Cor: PMI nondisplaced. Regular rate & rhythm. No rubs, gallops or murmurs. Lungs: clear Abdomen: soft, nontender, nondistended. No hepatosplenomegaly. No bruits or masses. Good bowel sounds. Extremities: no cyanosis, clubbing, rash, edema Neuro: alert & oriented x 3, cranial nerves grossly intact. moves all 4 extremities w/o difficulty. Affect pleasant.  ECG: Not performed    ASSESSMENT & PLAN:  Chronic Systolic Heart Failure - NICM, newly diagnosed - Echo 3/23 ([facility]) EF 20-25%, RV mildly reduced - LHC 4/23 ([facility]) mild nonbostructive CAD - Suspect 2/2 longstanding untreated HTN  - Chest CT 5/23 + mediastinal/ hilar lymphadenopathy  - plan cMRI  - NYHA Class I-II. Euvolemic on exam. BP remains elevated (also out of Coreg) - Restart Coreg 12.5 mg bid - Increase Entresto to 49-51 mg bid - Start Farxiga 10 mg daily  - Continue Spiro 12.5 mg daily  - Reduce Lasix to 40 mg qod  - Bidil in future if BP still elevated after optimization of Entresto/Spiro  - discussed importance of continued daily wts, strict med compliance, sodium and fluid restriction - BMP today   - continue med titration q 2-3 weeks, until  fully optimized. Plan repeat echo in 3 months  - refer to the [facility]. Assign to Dr. Haroldine Laws    2. Hypertension  - remains elevated - suspect etiology of CM - needs outpatient sleep study  - GDMT per above, increasing Entresto today  - suspect underlying OSA. Arrange sleep study  - consider future renal artery dopplers if BP remains elevated despite GDMT optimization   3. CAD - mild nonobstructive disease on recent cath  - denies CP  - needs aggressive risk factor modification  -  recommend low dose statin and daily ASA 81 mg   4. HLD - LP at [facility] 4/23 w/ elevated LDL 93 mg/dL. HFTs nl  - LDL goal < 70 - recommend low dose statin. Start atorva 40 mg daily  - needs repeat FLP + HFTs in 6-8 wks   5. Snoring - suspect probable OSA - arrange sleep study     NYHA I-II GDMT  Diuretic- Lasix 40 mg qod  BB- Coreg 12.5 mg bid  Ace/ARB/ARNI Entresto 49-51 mg bid  MRA Spiro 12.5 mg daily  SGLT2i Farxiga 10 mg daily     Referred to HFSW (PCP, Medications, Transportation, ETOH Abuse, Drug Abuse, Insurance, Museum/gallery curator ): No  Refer to Pharmacy:  No Refer to Home Health:  No Refer to Advanced Heart Failure Clinic: Yes. Assign to Dr. Haroldine Laws   Refer to General Cardiology: No  Follow up  in the [facility] in 2-3 weeks for med titration

## 2022-02-17 NOTE — Progress Notes (Signed)
Patient Name: Mitchell Ochoa        DOB: [birth year]      Height: 6 2"    [id number]  Office Name:[facility]          Referring Provider:Brittainy Sharol Harness, PA/ Arvilla Meres, MD  Today's Date:02/17/2022   STOP BANG RISK ASSESSMENT S (snore) Have you been told that you snore?     YES   T (tired) Are you often tired, fatigued, or sleepy during the day?   NO  O (obstruction) Do you stop breathing, choke, or gasp during sleep? YES   P (pressure) Do you have or are you being treated for high blood pressure? YES   B (BMI) Is your body index greater than 35 kg/m? YES   A (age) Are you 42 years old or older? NO   N (neck) Do you have a neck circumference greater than 16 inches?   NO   G (gender) Are you a male? YES   TOTAL STOP/BANG "YES" ANSWERS                                                                        For Office Use Only              Procedure Order Form    YES to 3+ Stop Bang questions OR two clinical symptoms - patient qualifies for WatchPAT (CPT 95800)             Clinical Notes: Will consult Sleep Specialist and refer for management of therapy due to patient increased risk of Sleep Apnea. Ordering a sleep study due to the following two clinical symptoms:  Loud snoring R06.83 History of high blood pressure R03.0 /   I understand that I am proceeding with a home sleep apnea test as ordered by my treating physician. I understand that untreated sleep apnea is a serious cardiovascular risk factor and it is my responsibility to perform the test and seek management for sleep apnea. I will be contacted with the results and be managed for sleep apnea by a local sleep physician. I will be receiving equipment and further instructions from [facility]. I shall promptly ship back the equipment via the included mailing label. I understand my insurance will be billed for the test and as the patient I am responsible for any insurance related out-of-pocket  costs incurred. I have been provided with written instructions and can call for additional video or telephonic instruction, with 24-hour availability of qualified personnel to answer any questions: Patient Help Desk [phone].  Patient Signature ______________________________________________________   Date______________________ Patient Telemedicine Verbal Consent

## 2022-02-18 NOTE — Progress Notes (Signed)
CSW requested to assist patient with a PCP. [facility] at 9:20 am. Patient states he works until early morning and requesting to change appointment time. CSW provided contact information for patient to change time as needed. Patient grateful for the support and assistance. Lasandra Beech, LCSW, CCSW-MCS [phone]

## 2022-02-18 NOTE — Addendum Note (Signed)
Encounter addended by: Marcy Siren, LCSW on: 02/18/2022 2:19 PM  Actions taken: Clinical Note Signed

## 2022-02-19 ENCOUNTER — Ambulatory Visit: Payer: BC Managed Care – PPO | Admitting: Nurse Practitioner

## 2022-02-20 ENCOUNTER — Ambulatory Visit (INDEPENDENT_AMBULATORY_CARE_PROVIDER_SITE_OTHER): Payer: BC Managed Care – PPO | Admitting: Nurse Practitioner

## 2022-02-20 VITALS — BP 132/95 | HR 85 | Resp 18

## 2022-02-20 DIAGNOSIS — Z0000 Encounter for general adult medical examination without abnormal findings: Secondary | ICD-10-CM | POA: Diagnosis not present

## 2022-02-20 NOTE — Patient Instructions (Addendum)
1. Routine health maintenance  - CBC - Comprehensive metabolic panel - Magnesium   Follow up:  Follow up in 3 months or sooner if needed

## 2022-02-20 NOTE — Progress Notes (Signed)
@Patient  ID: Mitchell Ochoa, male    DOB: [birth year], 41 y.o.   MRN: [id number]  Chief Complaint  Patient presents with   Hospitalization Follow-up    Referring provider: Berna Bue  Recent significant events:  Hospital admission: 02/03/22  Hospital Course by problem list: 1. 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 Acute presentation in the setting of recent medication nonadherence. TTE 11/2021 with LVEF 20-25%, mild LV dilation, mild RV systolic dysfunction, mild MR. Left heart catheterization with mild, nonobstructive coronary artery disease. Cta chest was also obtained which showed no evidence of pulmonary embolism. Patient was diuresed with IV lasix 40mg  BID with great response and by the time of discharge patient had no orthopnea, or lower extremity edema. He was transitioned to oral lasix 40mg  daily. In addition to his lasix, patient was transitioned back to his entresto, spironolactone, farxiga, and carvedilol.    He has appointments scheduled with both his PCP and cardiologist.    2. Hypertension His hypertension was well controlled on the agents above.    3. Isolated hyperbilirubinemia  Patient noted to have a total bilirubin on admission labs of 3.1. The rest of his LFTs were within normal limits. Fractionation of the bilirubin showed predominant indirect hyperbilirubinemia. He had no evidence of hemolysis. Patient most likely with elevated bilirubin in the setting of his his heart failure.  -Patient will get this checked in the outpatient setting to ensure it is improved.    HPI  Patient presents today for hospital follow-up please see above notes.  Patient states that he has been doing well since hospital discharge.  Patient already has a cardiologist through [facility] and also has follow-up scheduled with cardiology with [facility].  We discussed that patient does need  to discuss this with [facility] cardiology.  Patient only needs 1 cardiologist. Denies f/c/s, n/v/d, hemoptysis, PND, chest pain or edema.    Allergies  Allergen Reactions   Metoprolol Tartrate Other (See Comments)    tachycardia     There is no immunization history on file for this patient.  Past Medical History:  Diagnosis Date   CAD (coronary artery disease) 12/2021   mild nonobstructive disease by cath ([facility])   CHF (congestive heart failure) (HCC)    Hypertension     Tobacco History: Social History   Tobacco Use  Smoking Status Former   Years: 20.00   Types: Cigarettes   Quit date: 09/30/2021   Years since quitting: 0.4  Smokeless Tobacco Never   Counseling given: Not Answered   Outpatient Encounter Medications as of 02/20/2022  Medication Sig   albuterol (VENTOLIN HFA) 108 (90 Base) MCG/ACT inhaler Inhale 1-2 puffs into the lungs every 6 (six) hours as needed for wheezing or shortness of breath.   atorvastatin (LIPITOR) 20 MG tablet Take 1 tablet (20 mg total) by mouth daily.   budesonide-formoterol (SYMBICORT) 160-4.5 MCG/ACT inhaler Inhale 2 puffs into the lungs in the morning and at bedtime. (Patient taking differently: Inhale 2 puffs into the lungs daily as needed (Shortness of breath).)   carvedilol (COREG) 12.5 MG tablet Take 1 tablet (12.5 mg total) by mouth 2 (two) times daily.   Cholecalciferol (VITAMIN D-3 PO) Take 1 capsule by mouth daily.   dapagliflozin propanediol (FARXIGA) 5 MG TABS tablet Take 2 tablets (10 mg total) by mouth daily.   furosemide (LASIX) 20 MG tablet Take 2 tablets (40 mg total)  by mouth every other day.   GARLIC PO Take 1 capsule by mouth daily.   levofloxacin (LEVAQUIN) 750 MG tablet Take 1 tablet (750 mg total) by mouth daily.   MAGNESIUM PO Take 1 tablet by mouth daily.   Multiple Vitamin (MULTIVITAMIN ADULT) TABS Take 1 tablet by mouth in the morning.   sacubitril-valsartan (ENTRESTO) 49-51 MG Take 1 tablet by mouth 2 (two)  times daily.   spironolactone (ALDACTONE) 25 MG tablet Take 0.5 tablets (12.5 mg total) by mouth daily.   No facility-administered encounter medications on file as of 02/20/2022.     Review of Systems  Review of Systems  Constitutional: Negative.   HENT: Negative.    Cardiovascular: Negative.   Gastrointestinal: Negative.   Allergic/Immunologic: Negative.   Neurological: Negative.   Psychiatric/Behavioral: Negative.        Physical Exam  BP (!) 132/95   Pulse 85   Resp 18   SpO2 98%   Wt Readings from Last 5 Encounters:  02/17/22 (!) 323 lb 9.6 oz (146.8 kg)  02/07/22 (!) 313 lb 11.2 oz (142.3 kg)  01/20/22 (!) 348 lb 6.4 oz (158 kg)  11/28/21 (!) 353 lb 2.8 oz (160.2 kg)  10/16/21 (!) 353 lb 3.2 oz (160.2 kg)     Physical Exam Vitals and nursing note reviewed.  Constitutional:      General: He is not in acute distress.    Appearance: He is well-developed.  Cardiovascular:     Rate and Rhythm: Normal rate and regular rhythm.  Pulmonary:     Effort: Pulmonary effort is normal.     Breath sounds: Normal breath sounds.  Skin:    General: Skin is warm and dry.  Neurological:     Mental Status: He is alert and oriented to person, place, and time.     Lab Results:  CBC    Component Value Date/Time   WBC 5.0 02/20/2022 1606   WBC 4.7 02/03/2022 0410   RBC 5.70 02/20/2022 1606   RBC 5.51 02/03/2022 0410   HGB 17.0 02/20/2022 1606   HCT 49.5 02/20/2022 1606   PLT 219 02/20/2022 1606   MCV 87 02/20/2022 1606   MCH 29.8 02/20/2022 1606   MCH 30.9 02/03/2022 0410   MCHC 34.3 02/20/2022 1606   MCHC 34.6 02/03/2022 0410   RDW 14.9 02/20/2022 1606   LYMPHSABS 2.2 02/03/2022 0410   MONOABS 0.4 02/03/2022 0410   EOSABS 0.1 02/03/2022 0410   BASOSABS 0.0 02/03/2022 0410    BMET    Component Value Date/Time   NA 141 02/20/2022 1606   K 4.3 02/20/2022 1606   CL 102 02/20/2022 1606   CO2 24 02/20/2022 1606   GLUCOSE 102 (H) 02/20/2022 1606   GLUCOSE 102  (H) 02/17/2022 1252   BUN 7 02/20/2022 1606   CREATININE 0.76 02/20/2022 1606   CALCIUM 9.3 02/20/2022 1606   GFRNONAA >60 02/17/2022 1252   GFRAA >60 11/04/2019 0003    BNP    Component Value Date/Time   BNP 901.0 (H) 02/17/2022 1252    ProBNP No results found for: PROBNP  Imaging: DG Chest 2 View  Result Date: 02/03/2022 CLINICAL DATA:  Shortness of breath, vomiting, history CHF. EXAM: CHEST - 2 VIEW COMPARISON:  CT 01/08/2022 without contrast. FINDINGS: The heart is moderately enlarged. There is central vascular prominence. There is mild interstitial edema in the lower lung fields. There is perihilar interstitial haziness which could be ground-glass edema or pneumonitis, with elevated right hemidiaphragm again  shown. There is a stable mediastinal configuration. Slight aortic tortuosity. There are minimal pleural effusions. The upper lung fields are clear.  No acute osseous findings. IMPRESSION: Cardiomegaly with perihilar vascular congestion and mild interstitial edema. Findings consistent with CHF or fluid overload. Perihilar haziness most likely reflects ground-glass edema, less likely pneumonitis. Minimal pleural effusions are beginning to develop. Clinical correlation and radiographic follow-up recommended. Electronically Signed   By: Telford Nab M.D.   On: 02/03/2022 05:01   CT Angio Chest PE W/Cm &/Or Wo Cm  Result Date: 02/03/2022 CLINICAL DATA:  Pulmonary embolism (PE) suspected, high prob EXAM: CT ANGIOGRAPHY CHEST WITH CONTRAST TECHNIQUE: Multidetector CT imaging of the chest was performed using the standard protocol during bolus administration of intravenous contrast. Multiplanar CT image reconstructions and MIPs were obtained to evaluate the vascular anatomy. RADIATION DOSE REDUCTION: This exam was performed according to the departmental dose-optimization program which includes automated exposure control, adjustment of the mA and/or kV according to patient size and/or use of  iterative reconstruction technique. CONTRAST:  33mL OMNIPAQUE IOHEXOL 350 MG/ML SOLN COMPARISON:  01/08/2022 FINDINGS: Cardiovascular: Satisfactory opacification of the pulmonary arteries. Evaluation of the more distal pulmonary arterial branches is degraded by respiratory motion artifact, particularly within the lung bases. Within this limitation, there is no evidence of pulmonary embolism to the lobar branch level. Thoracic aorta is nonaneurysmal. Moderate cardiomegaly. No pericardial effusion. Mediastinum/Nodes: Redemonstration of multiple mildly enlarged mediastinal lymph nodes. Reference nodes include 1.1 cm prevascular node (series 5, image 47), unchanged. Low right paratracheal node measuring approximately 1.2 cm (series 5, image 53), previously 1.4 cm. Low right hilar node is not well evaluated today given phase of contrast, but appears grossly stable when compared with the previous exam. No axillary lymphadenopathy. Thyroid gland, trachea, and esophagus demonstrate no significant findings. Lungs/Pleura: Bilateral perihilar and bibasilar ground-glass opacity with subtle areas of nodularity. Findings are most pronounced within the right middle lobe. Trace right pleural effusion. No pneumothorax. Upper Abdomen: Reflux of contrast into the IVC and hepatic veins. Eventration of the right hemidiaphragm. No acute findings within the included upper abdomen. Musculoskeletal: No chest wall abnormality. No acute or significant osseous findings. Review of the MIP images confirms the above findings. IMPRESSION: 1. No evidence of pulmonary embolism to the lobar branch level. 2. Bilateral perihilar and bibasilar ground-glass opacity, most pronounced within the right middle lobe. Findings may represent pulmonary edema versus an infectious or inflammatory process. 3. Trace right pleural effusion. 4. Moderate cardiomegaly with reflux of contrast into the IVC and hepatic veins, suggesting right heart dysfunction. 5. Stable  mediastinal lymphadenopathy, likely reactive. Electronically Signed   By: Davina Poke D.O.   On: 02/03/2022 08:39     Assessment & Plan:   Routine health maintenance - CBC - Comprehensive metabolic panel - Magnesium   Follow up:  Follow up in 3 months or sooner if needed     Fenton Foy, NP 02/24/2022

## 2022-02-21 LAB — COMPREHENSIVE METABOLIC PANEL
ALT: 27 IU/L (ref 0–44)
AST: 22 IU/L (ref 0–40)
Albumin/Globulin Ratio: 2 (ref 1.2–2.2)
Albumin: 4.3 g/dL (ref 4.0–5.0)
Alkaline Phosphatase: 57 IU/L (ref 44–121)
BUN/Creatinine Ratio: 9 (ref 9–20)
BUN: 7 mg/dL (ref 6–24)
Bilirubin Total: 1.6 mg/dL — ABNORMAL HIGH (ref 0.0–1.2)
CO2: 24 mmol/L (ref 20–29)
Calcium: 9.3 mg/dL (ref 8.7–10.2)
Chloride: 102 mmol/L (ref 96–106)
Creatinine, Ser: 0.76 mg/dL (ref 0.76–1.27)
Globulin, Total: 2.2 g/dL (ref 1.5–4.5)
Glucose: 102 mg/dL — ABNORMAL HIGH (ref 70–99)
Potassium: 4.3 mmol/L (ref 3.5–5.2)
Sodium: 141 mmol/L (ref 134–144)
Total Protein: 6.5 g/dL (ref 6.0–8.5)
eGFR: 116 mL/min/{1.73_m2} (ref >59)

## 2022-02-21 LAB — CBC
Hematocrit: 49.5 % (ref 37.5–51.0)
Hemoglobin: 17 g/dL (ref 13.0–17.7)
MCH: 29.8 pg (ref 26.6–33.0)
MCHC: 34.3 g/dL (ref 31.5–35.7)
MCV: 87 fL (ref 79–97)
Platelets: 219 10*3/uL (ref 150–450)
RBC: 5.7 x10E6/uL (ref 4.14–5.80)
RDW: 14.9 % (ref 11.6–15.4)
WBC: 5 10*3/uL (ref 3.4–10.8)

## 2022-02-21 LAB — MAGNESIUM: Magnesium: 1.9 mg/dL (ref 1.6–2.3)

## 2022-02-23 NOTE — Progress Notes (Signed)
Advanced Heart Failure Clinic Note   Referring Physician: Dr. Bjorn Pippin  Primary Care: [facility]  Primary Cardiologist: [facility]  HPI:  Referred to clinic by Dr. Bjorn Pippin, cardiology, for heart failure consultation.    41 y/o AAM w/ prior h/o uncontrolled/ untreated HTN and newly diagnosed systolic heart failure. Diagnosed w/ HF 11/2021, in setting of uncontrolled HTN. Evaluated at [facility]. Echo 11/2021 EF 20-25%, RV mildly reduced. Underwent LHC 12/2021 demonstrating mild nonobstructive CAD c/w NICM. Felt to be hypertensive CM w/ probable OSA. Placed on GDMT and recommended for outpatient sleep study. Of note at [facility], LDL 93 mg/dL. Hgb A1c 4.9.    Recently hospitalization at [facility] 01/2022 for a/c CHF in setting of dietary indiscretion and poor compliance w/ meds. Noted to be hypertensive on admit, 160s/110s. Chest CT negative for PE but showed bilateral perihilar and bibasilar ground-glass opacity, most pronounced within the right middle lobe. Findings may represent pulmonary edema versus an infectious or inflammatory process.   Diuresed w/ IV Lasix and placed back on antihypertensives/GDMT. Diuresed 17L. D/c wt 312 lbs. Outpatient cMRI recommended. Referred to [facility] clinic.    Presents to HF [facility] clinic for assessment 02/17/22. Came w/ his mother. Reported improved symptoms. Breathing/ functional capacity were much improved, NYHA Class I-II. Denied orthopnea/PND. No LEE. Weight in clinic was elevated at 323 lbs. No LEE. Reported full med compliance but ran out of carvedilol. Had Farxiga at home but hadn't starting taking it yet. Reported taking all other meds. BP was elevated at 152/112. No side effects w/ meds. Reported h/o snoring. Denied tobacco/ ETOH. Believed he may have had COVID early 2020. He wanted to switch cardiac care to [facility].     Today he returns to HF clinic for pharmacist medication titration. At last visit with APP, Sherryll Burger was increased to 49/51 mg BID  and Lasix was decreased to 40 mg every other day. Additionally, carvedilol 12.5 mg BID was restarted and he was instructed to start Farxiga and atorvastatin. He was referred to [facility] for continued medication titration and follow up. Overall he is feeling well today. Says he feels much better on his heart failure medications. Of note, he has not started atorvastatin yet and has been taking spironolactone 25 mg daily instead of 12.5 mg daily. No dizziness or lightheadedness. Mild chest discomfort if he misses his medications. No palpitations. Has been going to the gym. Was speed walking but has now moved up to the elliptical. Weight at home has been stable at 320-327 lbs. He has been taking Lasix 40 mg every other day. Has used 3 days of extra Lasix since his last visit when he felt like his fluid level was up. No LEE, PND or orthopnea.    HF Medications: Carvedilol 12.5 mg BID Entresto 49/51 mg BID Spironolactone 12.5 mg daily - has been taking 25 mg daily Farxiga 10 mg daily  Has the patient been experiencing any side effects to the medications prescribed?  no  Does the patient have any problems obtaining medications due to transportation or finances?   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 Patient understands to avoid decongestants.    Pertinent Lab Values: 02/20/22: Serum creatinine 0.76, BUN 7, Potassium 4.3, Sodium 141  Vital Signs: Weight: 324.4 lbs (last clinic weight: 323.6 lbs) Blood pressure: 140/98  Heart rate: 73   Assessment/Plan: Chronic Systolic Heart Failure -  NICM, newly diagnosed - Echo 11/2021 ([facility]) EF 20-25%, RV mildly reduced - LHC 12/2021 ([facility]) mild nonbostructive CAD - Suspect 2/2 longstanding untreated HTN  - Chest CT 01/2022 + mediastinal/ hilar lymphadenopathy  - NYHA Class I-II. Euvolemic on exam.  - Continue Lasix to 40 mg  every other day  - Continue carvedilol 12.5 mg BID - Increase Entresto to 97/103 mg BID. Repeat BMET in 4 weeks. - Continue Spironolactone 25 mg daily. Updated medication list to reflect that he has been taking 25 mg daily, not 12.5 mg daily.   - Continue Farxiga 10 mg daily  - discussed importance of continued daily wts, strict med compliance, sodium and fluid restriction - continue med titration q 2-3 weeks, until fully optimized. Plan repeat echo in 3 months     2. Hypertension  - remains elevated - suspect etiology of CM - needs outpatient sleep study  - GDMT per above, increasing Entresto today  - consider future renal artery dopplers if BP remains elevated despite GDMT optimization    3. CAD - mild nonobstructive disease on recent cath  - denies CP  - needs aggressive risk factor modification  -  start atorvastatin - he never picked medication up after last visit.    4. HLD - LP at [facility] 12/2021 w/ elevated LDL 93 mg/dL. HFTs wnl  - LDL goal < 70 - start atorvastatin - needs repeat FLP + HFTs in 6-8 wks    5. Snoring - suspect probable OSA - previously referred for sleep study      Follow up 4 weeks with Pharmacy Clinic   Audry Riles, PharmD, BCPS, BCCP, [facility] Pharmacist [phone]

## 2022-02-24 ENCOUNTER — Encounter: Payer: Self-pay | Admitting: Nurse Practitioner

## 2022-02-24 DIAGNOSIS — Z0000 Encounter for general adult medical examination without abnormal findings: Secondary | ICD-10-CM | POA: Insufficient documentation

## 2022-02-24 NOTE — Assessment & Plan Note (Signed)
-   CBC - Comprehensive metabolic panel - Magnesium   Follow up:  Follow up in 3 months or sooner if needed

## 2022-03-04 ENCOUNTER — Telehealth (HOSPITAL_COMMUNITY): Payer: Self-pay | Admitting: *Deleted

## 2022-03-04 NOTE — Telephone Encounter (Signed)
Request for home sleep study faxed to [facility] [place]. Fax [phone]

## 2022-03-10 ENCOUNTER — Ambulatory Visit (HOSPITAL_COMMUNITY)
Admission: RE | Admit: 2022-03-10 | Discharge: 2022-03-10 | Disposition: A | Discharge status: Home / Self Care | Payer: BC Managed Care – PPO | Source: Ambulatory Visit | Attending: Internal Medicine | Admitting: Internal Medicine

## 2022-03-10 VITALS — BP 140/98 | HR 73 | Wt 324.4 lb

## 2022-03-10 DIAGNOSIS — I428 Other cardiomyopathies: Secondary | ICD-10-CM | POA: Insufficient documentation

## 2022-03-10 DIAGNOSIS — I110 Hypertensive heart disease with heart failure: Secondary | ICD-10-CM | POA: Diagnosis not present

## 2022-03-10 DIAGNOSIS — Z7901 Long term (current) use of anticoagulants: Secondary | ICD-10-CM | POA: Insufficient documentation

## 2022-03-10 DIAGNOSIS — Z7984 Long term (current) use of oral hypoglycemic drugs: Secondary | ICD-10-CM | POA: Diagnosis not present

## 2022-03-10 DIAGNOSIS — Z79899 Other long term (current) drug therapy: Secondary | ICD-10-CM | POA: Diagnosis not present

## 2022-03-10 DIAGNOSIS — I2510 Atherosclerotic heart disease of native coronary artery without angina pectoris: Secondary | ICD-10-CM | POA: Diagnosis not present

## 2022-03-10 DIAGNOSIS — I5022 Chronic systolic (congestive) heart failure: Secondary | ICD-10-CM

## 2022-03-10 DIAGNOSIS — R0683 Snoring: Secondary | ICD-10-CM | POA: Diagnosis not present

## 2022-03-10 DIAGNOSIS — E785 Hyperlipidemia, unspecified: Secondary | ICD-10-CM | POA: Insufficient documentation

## 2022-03-10 LAB — AFB CULTURE WITH SMEAR (NOT AT ARMC)
Acid Fast Culture: NEGATIVE
Acid Fast Smear: NEGATIVE

## 2022-03-10 MED ORDER — SACUBITRIL-VALSARTAN 97-103 MG PO TABS: 1.0000 | ORAL_TABLET | Freq: Two times a day (BID) | ORAL | 5 refills | Status: DC

## 2022-03-10 MED ORDER — DAPAGLIFLOZIN PROPANEDIOL 10 MG PO TABS: 10.0000 mg | ORAL_TABLET | Freq: Every day | ORAL | 5 refills | Status: DC

## 2022-03-10 MED ORDER — SPIRONOLACTONE 25 MG PO TABS
25.0000 mg | ORAL_TABLET | Freq: Every day | ORAL | 5 refills | Status: DC
Start: 2022-03-10 — End: 2023-12-22

## 2022-03-10 NOTE — Patient Instructions (Addendum)
It was a pleasure seeing you today!  MEDICATIONS: -We are changing your medications today -Increase Entresto to 97/103 mg (1 tablet) twice daily. You may take 2 tablets of the 49/51 mg strength twice daily until you receive the new strength.  -Call if you have questions about your medications.   NEXT APPOINTMENT: Return to clinic in 4 weeks with Pharmacy Clinic.  In general, to take care of your heart failure: -Limit your fluid intake to 2 Liters (half-gallon) per day.   -Limit your salt intake to ideally 2-3 grams (2000-3000 mg) per day. -Weigh yourself daily and record, and bring that "weight diary" to your next appointment.  (Weight gain of 2-3 pounds in 1 day typically means fluid weight.) -The medications for your heart are to help your heart and help you live longer.   -Please contact us before stopping any of your heart medications.  Call the clinic at [phone] with questions or to reschedule future appointments.

## 2022-03-11 ENCOUNTER — Telehealth (HOSPITAL_COMMUNITY): Payer: Self-pay | Admitting: Surgery

## 2022-03-11 NOTE — Telephone Encounter (Signed)
I called patient to let him know that he can proceed to complete his ordered home sleep study and that insurance prior Mitchell Ochoa is not needed.  I left a message to request that he complete the study within the next week.

## 2022-03-27 ENCOUNTER — Telehealth (HOSPITAL_COMMUNITY): Payer: Self-pay | Admitting: Surgery

## 2022-03-27 NOTE — Telephone Encounter (Signed)
Patient called to again remind him to complete ordered home sleep study.  I left a message and requested that he do the study within the next few nights or return to our clinic.

## 2022-03-30 ENCOUNTER — Telehealth (HOSPITAL_COMMUNITY): Payer: Self-pay | Admitting: *Deleted

## 2022-03-30 NOTE — Telephone Encounter (Addendum)
[employer] auth faxed to [facility]

## 2022-04-08 ENCOUNTER — Inpatient Hospital Stay (HOSPITAL_COMMUNITY): Admission: RE | Admit: 2022-04-08 | Payer: BC Managed Care – PPO | Source: Ambulatory Visit

## 2022-04-09 ENCOUNTER — Emergency Department (HOSPITAL_COMMUNITY)
Admission: EM | Admit: 2022-04-09 | Discharge: 2022-04-10 | Discharge status: Against Medical Advice | Payer: BC Managed Care – PPO | Attending: Emergency Medicine | Admitting: Emergency Medicine

## 2022-04-09 ENCOUNTER — Emergency Department (HOSPITAL_COMMUNITY): Payer: BC Managed Care – PPO

## 2022-04-09 DIAGNOSIS — I110 Hypertensive heart disease with heart failure: Secondary | ICD-10-CM | POA: Insufficient documentation

## 2022-04-09 DIAGNOSIS — Z20822 Contact with and (suspected) exposure to covid-19: Secondary | ICD-10-CM | POA: Insufficient documentation

## 2022-04-09 DIAGNOSIS — T7804XA Anaphylactic reaction due to fruits and vegetables, initial encounter: Secondary | ICD-10-CM | POA: Insufficient documentation

## 2022-04-09 DIAGNOSIS — T782XXA Anaphylactic shock, unspecified, initial encounter: Secondary | ICD-10-CM

## 2022-04-09 DIAGNOSIS — T7840XA Allergy, unspecified, initial encounter: Secondary | ICD-10-CM | POA: Diagnosis present

## 2022-04-09 DIAGNOSIS — R0602 Shortness of breath: Secondary | ICD-10-CM | POA: Diagnosis not present

## 2022-04-09 DIAGNOSIS — R220 Localized swelling, mass and lump, head: Secondary | ICD-10-CM | POA: Diagnosis not present

## 2022-04-09 DIAGNOSIS — I509 Heart failure, unspecified: Secondary | ICD-10-CM | POA: Insufficient documentation

## 2022-04-09 DIAGNOSIS — Z79899 Other long term (current) drug therapy: Secondary | ICD-10-CM | POA: Insufficient documentation

## 2022-04-09 LAB — CBC WITH DIFFERENTIAL/PLATELET
Abs Immature Granulocytes: 0.04 10*3/uL (ref 0.00–0.07)
Basophils Absolute: 0 10*3/uL (ref 0.0–0.1)
Basophils Relative: 0 %
Eosinophils Absolute: 0.1 10*3/uL (ref 0.0–0.5)
Eosinophils Relative: 1 %
HCT: 56 % — ABNORMAL HIGH (ref 39.0–52.0)
Hemoglobin: 19 g/dL — ABNORMAL HIGH (ref 13.0–17.0)
Immature Granulocytes: 1 %
Lymphocytes Relative: 45 %
Lymphs Abs: 2.5 10*3/uL (ref 0.7–4.0)
MCH: 30.3 pg (ref 26.0–34.0)
MCHC: 33.9 g/dL (ref 30.0–36.0)
MCV: 89.3 fL (ref 80.0–100.0)
Monocytes Absolute: 0.1 10*3/uL (ref 0.1–1.0)
Monocytes Relative: 2 %
Neutro Abs: 2.8 10*3/uL (ref 1.7–7.7)
Neutrophils Relative %: 51 %
Platelets: 229 10*3/uL (ref 150–400)
RBC: 6.27 MIL/uL — ABNORMAL HIGH (ref 4.22–5.81)
RDW: 16.4 % — ABNORMAL HIGH (ref 11.5–15.5)
WBC: 5.5 10*3/uL (ref 4.0–10.5)
nRBC: 0 % (ref 0.0–0.2)

## 2022-04-09 LAB — I-STAT VENOUS BLOOD GAS, ED
Acid-base deficit: 4 mmol/L — ABNORMAL HIGH (ref 0.0–2.0)
Bicarbonate: 24 mmol/L (ref 20.0–28.0)
Calcium, Ion: 0.96 mmol/L — ABNORMAL LOW (ref 1.15–1.40)
HCT: 61 % — ABNORMAL HIGH (ref 39.0–52.0)
Hemoglobin: 20.7 g/dL — ABNORMAL HIGH (ref 13.0–17.0)
O2 Saturation: 96 %
Potassium: 5.5 mmol/L — ABNORMAL HIGH (ref 3.5–5.1)
Sodium: 139 mmol/L (ref 135–145)
TCO2: 26 mmol/L (ref 22–32)
pCO2, Ven: 53.4 mmHg (ref 44–60)
pH, Ven: 7.261 (ref 7.25–7.43)
pO2, Ven: 94 mmHg — ABNORMAL HIGH (ref 32–45)

## 2022-04-09 LAB — BRAIN NATRIURETIC PEPTIDE: B Natriuretic Peptide: 99 pg/mL (ref 0.0–100.0)

## 2022-04-09 MED ORDER — METHYLPREDNISOLONE SODIUM SUCC 125 MG IJ SOLR
125.0000 mg | Freq: Once | INTRAMUSCULAR | Status: AC
  Administered 2022-04-09: 125 mg via INTRAVENOUS
  Filled 2022-04-09: qty 2

## 2022-04-09 MED ORDER — SODIUM CHLORIDE 0.9 % IV BOLUS
500.0000 mL | Freq: Once | INTRAVENOUS | Status: AC
  Administered 2022-04-09: 500 mL via INTRAVENOUS

## 2022-04-09 MED ORDER — ALBUTEROL SULFATE (2.5 MG/3ML) 0.083% IN NEBU
10.0000 mg/h | INHALATION_SOLUTION | Freq: Once | RESPIRATORY_TRACT | Status: DC
Start: 2022-04-09 — End: 2022-04-10
  Filled 2022-04-09: qty 12

## 2022-04-09 MED ORDER — EPINEPHRINE 0.3 MG/0.3ML IJ SOAJ
INTRAMUSCULAR | Status: AC
  Administered 2022-04-09: 0.3 mg via INTRAMUSCULAR
  Filled 2022-04-09: qty 0.3
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
## 2022-04-09 NOTE — ED Notes (Signed)
Co worker Onalee Hua called asking to talk to Mitchell Ochoa. His number is [phone]

## 2022-04-09 NOTE — ED Provider Notes (Signed)
[facility] EMERGENCY DEPARTMENT Provider Note   CSN: [id number] Arrival date & time: 04/09/22  2130     History  Chief Complaint  Patient presents with   Allergic Reaction    Mitchell Ochoa is a 41 y.o. male.  HPI     41 year old male comes in with chief complaint of allergic reaction.  Patient indicates that he had some clementines about 3 hours ago.  Hour after that ingestion, he started having swelling in his mouth, scratchy sensation in the back of his throat and some shortness of breath.   EMS assessed the patient.  They gave him Benadryl en route.  Patient has history of nonischemic cardiomyopathy with EF of 20 to 25%.  Home Medications Prior to Admission medications   Medication Sig Start Date End Date Taking? Authorizing Provider  albuterol (VENTOLIN HFA) 108 (90 Base) MCG/ACT inhaler Inhale 1-2 puffs into the lungs every 6 (six) hours as needed for wheezing or shortness of breath. 10/14/21   Mardella Layman, MD  atorvastatin (LIPITOR) 20 MG tablet Take 1 tablet (20 mg total) by mouth daily. 02/17/22   Robbie Lis M, PA-C  budesonide-formoterol (SYMBICORT) 160-4.5 MCG/ACT inhaler Inhale 2 puffs into the lungs in the morning and at bedtime. 10/16/21   Mannam, Colbert Coyer, MD  carvedilol (COREG) 12.5 MG tablet Take 1 tablet (12.5 mg total) by mouth 2 (two) times daily. 02/17/22   Allayne Butcher, PA-C  Cholecalciferol (VITAMIN D-3 PO) Take 1 capsule by mouth daily.    [provider]  dapagliflozin propanediol (FARXIGA) 10 MG TABS tablet Take 1 tablet (10 mg total) by mouth daily. 03/10/22   Bensimhon, Bevelyn Buckles, MD  furosemide (LASIX) 20 MG tablet Take 2 tablets (40 mg total) by mouth every other day. 02/17/22   Simmons, Brittainy M, PA-C  GARLIC PO Take 1 capsule by mouth daily.    [provider]  MAGNESIUM PO Take 1 tablet by mouth daily.    [provider]  Multiple Vitamin (MULTIVITAMIN ADULT) TABS Take 1 tablet by  mouth in the morning.    [provider]  sacubitril-valsartan (ENTRESTO) 97-103 MG Take 1 tablet by mouth 2 (two) times daily. 03/10/22   Bensimhon, Bevelyn Buckles, MD  spironolactone (ALDACTONE) 25 MG tablet Take 1 tablet (25 mg total) by mouth daily. 03/10/22   Bensimhon, Bevelyn Buckles, MD      Allergies    Metoprolol tartrate    Review of Systems   Review of Systems  All other systems reviewed and are negative.   Physical Exam Updated Vital Signs BP 94/60   Pulse (!) 111   Temp 97.9 F (36.6 C) (Oral)   Resp 16   SpO2 (!) 84%  Physical Exam Vitals and nursing note reviewed.  Constitutional:      Appearance: He is well-developed.     Comments: Somnolent, but follows all commands  HENT:     Head: Atraumatic.     Comments: Patient has facial swelling, no clear oral swelling appreciated. Cardiovascular:     Rate and Rhythm: Normal rate.  Pulmonary:     Effort: Pulmonary effort is normal.     Breath sounds: No wheezing.  Abdominal:     Tenderness: There is no abdominal tenderness.  Musculoskeletal:     Cervical back: Neck supple.  Skin:    General: Skin is warm.  Neurological:     Mental Status: He is oriented to person, place, and time.     ED Results /  Procedures / Treatments   Labs (all labs ordered are listed, but only abnormal results are displayed) Labs Reviewed  CBC WITH DIFFERENTIAL/PLATELET - Abnormal; Notable for the following components:      Result Value   RBC 6.27 (*)    Hemoglobin 19.0 (*)    HCT 56.0 (*)    RDW 16.4 (*)    All other components within normal limits  I-STAT VENOUS BLOOD GAS, ED - Abnormal; Notable for the following components:   pO2, Ven 94 (*)    Acid-base deficit 4.0 (*)    Potassium 5.5 (*)    Calcium, Ion 0.96 (*)    HCT 61.0 (*)    Hemoglobin 20.7 (*)    All other components within normal limits  BRAIN NATRIURETIC PEPTIDE  BASIC METABOLIC PANEL    EKG EKG Interpretation  Date/Time:  Thursday April 09 2022 21:48:12  EDT Ventricular Rate:  71 PR Interval:  157 QRS Duration: 103 QT Interval:  422 QTC Calculation: 459 R Axis:   -74 Text Interpretation: Sinus rhythm Right atrial enlargement LAD, consider left anterior fascicular block Anterior infarct, old Borderline T abnormalities, inferior leads No acute changes Confirmed by Varney Biles [id number]) on 04/09/2022 10:33:40 PM  Radiology DG Chest Port 1 View  Result Date: 04/09/2022 CLINICAL DATA:  Shortness of breath, fatigue. EXAM: PORTABLE CHEST 1 VIEW COMPARISON:  02/03/2022. FINDINGS: Heart is enlarged the mediastinal contour is within normal limits. The pulmonary vasculature is prominent. Lung volumes are low. There is chronic elevation of the right diaphragm. No consolidation, effusion, or pneumothorax. No acute osseous abnormality. IMPRESSION: Cardiomegaly with mildly distended pulmonary vasculature. Electronically Signed   By: Brett Fairy M.D.   On: 04/09/2022 23:03    Procedures .Critical Care  Performed by: Varney Biles, MD Authorized by: Varney Biles, MD   Critical care provider statement:    Critical care time (minutes):  40   Critical care was necessary to treat or prevent imminent or life-threatening deterioration of the following conditions:  Respiratory failure (Anaphylactic reaction)   Critical care was time spent personally by me on the following activities:  Development of treatment plan with patient or surrogate, discussions with consultants, evaluation of patient's response to treatment, examination of patient, ordering and review of laboratory studies, ordering and review of radiographic studies, ordering and performing treatments and interventions, pulse oximetry, re-evaluation of patient's condition and review of old charts     Medications Ordered in ED Medications  albuterol (PROVENTIL) (2.5 MG/3ML) 0.083% nebulizer solution (has no administration in time range)  EPINEPHrine (EPI-PEN) 0.3 mg/0.3 mL injection (0.3 mg  Intramuscular Given 04/09/22 2226)  sodium chloride 0.9 % bolus 500 mL (500 mLs Intravenous New Bag/Given 04/09/22 2231)    ED Course/ Medical Decision Making/ A&P Clinical Course as of 04/09/22 2335  Thu Apr 09, 2022  2328 Pulse Rate(!): 111 Last set of vital signs show slightly low blood pressure.  Patient has received 500 cc of fluid.  He also has hypoxia. [AN]  2329 pH, Ven: 7.261 Patient noted to have acidosis.  Normal bicarb, so assume respiratory acidosis.  PCO2 in the 50s.  We will place him on BiPAP. [AN]  2329 Patient reassessed.  Heart rate is in the 70s.  Blood pressure in the 120s. He is somnolent, still easily arousable and answers all the questions appropriately.  He states that he usually uses a CPAP at night.  Plan is to put him on BiPAP now.  We will repeat the blood gas at  1:00.  Disposition based on how he is doing at 1 AM. [AN]    Clinical Course User Index [AN] Derwood Kaplan, MD                           Medical Decision Making Amount and/or Complexity of Data Reviewed Labs: ordered. Decision-making details documented in ED Course. Radiology: ordered.  Risk Prescription drug management.   This patient presents to the ED with chief complaint(s) of allergic reaction with pertinent past medical history of congestive heart failure from nonischemic cardiomyopathy, hypertension which further complicates the presenting complaint. The complaint involves an extensive differential diagnosis and also carries with it a high risk of complications and morbidity.    Patient has mild facial swelling, he is noted to be hypotensive and had a diarrhea-like bowel movement.  Given the constellation of findings, suspicion is high for anaphylaxis.  Patient had received IV Benadryl prior to ED arrival.  We will give him epi and Solu-Medrol.  Patient appears sleepy.  Could be because of Benadryl. We will get venous blood gas.  X-ray visualized and interpreted independently.  No  evidence of pneumonia.  Reassessment was done at 1130.  Patient denies any abdominal pain, nausea, vomiting, fevers, chills, cough, UTI-like symptoms.  He states that he was feeling well until 3 or 4 hours ago.  Low clinical suspicion for infection.  CBC shows no leukocytosis.  Patient's care has been signed out to incoming team.  Disposition per reassessment and repeat blood gas.  Final Clinical Impression(s) / ED Diagnoses Final diagnoses:  Anaphylaxis, initial encounter    Rx / DC Orders ED Discharge Orders     None         Derwood Kaplan, MD 04/09/22 2335

## 2022-04-09 NOTE — ED Notes (Signed)
Nanavati MD aware of pt's low BP. RN began 1L of fluid os NS.

## 2022-04-10 ENCOUNTER — Other Ambulatory Visit: Payer: Self-pay

## 2022-04-10 ENCOUNTER — Encounter (HOSPITAL_COMMUNITY): Payer: Self-pay

## 2022-04-10 LAB — I-STAT VENOUS BLOOD GAS, ED
Acid-base deficit: 4 mmol/L — ABNORMAL HIGH (ref 0.0–2.0)
Bicarbonate: 25.8 mmol/L (ref 20.0–28.0)
Calcium, Ion: 1.19 mmol/L (ref 1.15–1.40)
HCT: 59 % — ABNORMAL HIGH (ref 39.0–52.0)
Hemoglobin: 20.1 g/dL — ABNORMAL HIGH (ref 13.0–17.0)
O2 Saturation: 22 %
Potassium: 3.9 mmol/L (ref 3.5–5.1)
Sodium: 145 mmol/L (ref 135–145)
TCO2: 28 mmol/L (ref 22–32)
pCO2, Ven: 63.4 mmHg — ABNORMAL HIGH (ref 44–60)
pH, Ven: 7.217 — ABNORMAL LOW (ref 7.25–7.43)
pO2, Ven: 20 mmHg — CL (ref 32–45)

## 2022-04-10 LAB — I-STAT ARTERIAL BLOOD GAS, ED
Acid-base deficit: 3 mmol/L — ABNORMAL HIGH (ref 0.0–2.0)
Bicarbonate: 22.1 mmol/L (ref 20.0–28.0)
Calcium, Ion: 1.28 mmol/L (ref 1.15–1.40)
HCT: 56 % — ABNORMAL HIGH (ref 39.0–52.0)
Hemoglobin: 19 g/dL — ABNORMAL HIGH (ref 13.0–17.0)
O2 Saturation: 98 %
Patient temperature: 97.8
Potassium: 4.5 mmol/L (ref 3.5–5.1)
Sodium: 140 mmol/L (ref 135–145)
TCO2: 23 mmol/L (ref 22–32)
pCO2 arterial: 37.9 mmHg (ref 32–48)
pH, Arterial: 7.372 (ref 7.35–7.45)
pO2, Arterial: 113 mmHg — ABNORMAL HIGH (ref 83–108)

## 2022-04-10 LAB — SARS CORONAVIRUS 2 BY RT PCR: SARS Coronavirus 2 by RT PCR: NEGATIVE

## 2022-04-10 LAB — I-STAT CHEM 8, ED
BUN: 8 mg/dL (ref 6–20)
Calcium, Ion: 1.16 mmol/L (ref 1.15–1.40)
Chloride: 106 mmol/L (ref 98–111)
Creatinine, Ser: 0.8 mg/dL (ref 0.61–1.24)
Glucose, Bld: 77 mg/dL (ref 70–99)
HCT: 57 % — ABNORMAL HIGH (ref 39.0–52.0)
Hemoglobin: 19.4 g/dL — ABNORMAL HIGH (ref 13.0–17.0)
Potassium: 3.9 mmol/L (ref 3.5–5.1)
Sodium: 144 mmol/L (ref 135–145)
TCO2: 26 mmol/L (ref 22–32)

## 2022-04-10 MED ORDER — FAMOTIDINE IN NACL 20-0.9 MG/50ML-% IV SOLN
20.0000 mg | Freq: Once | INTRAVENOUS | Status: AC
  Administered 2022-04-10: 20 mg via INTRAVENOUS
  Filled 2022-04-10: qty 50

## 2022-04-10 NOTE — Progress Notes (Signed)
I was called by ED physician to admit this patient for monitoring after anaphylaxis.  However, before I could see the patient, I was informed by RN that patient wanted to leave.  ED physician Dr. Nicanor Alcon was notified and talked to the patient, however, he decided to leave AMA.  When I arrived to his room, I was notified by staff that patient had already left the ED.

## 2022-04-10 NOTE — ED Notes (Signed)
RT at bedside to place patient on bi-pap at this time. 

## 2022-04-10 NOTE — ED Notes (Signed)
Patient adamant about leaving AMA. Patient advised complications and concerns about leaving against medical advice. Patient stated he feels fine. He denies any difficulty breathing. Patient denies any chest pain or swelling to the face. Admitting provider made aware and ED provider made aware.

## 2022-04-13 ENCOUNTER — Telehealth (HOSPITAL_COMMUNITY): Payer: Self-pay | Admitting: Emergency Medicine

## 2022-04-13 NOTE — Telephone Encounter (Signed)
I called and spoke with Mitchell Ochoa. with [facility] and was told there is an [place] on file that expired in June from another physician. she said that office would need to call and state that MRI was not performed. She said she did not see my auth on file that I submitted on 7/10. Mitchell Ochoa said my auth request Mitchell Ochoa been rejected because there was an existing auth with the same cpt code on file.  I told her that our pre service dept messaged me that they see an auth on file for a different facility. She again stated that Berkley Harvey is from a different provider and that before another auth could be initiated they would need to call and state pt never had procedure performed.    I called pt to have him contact the ordering provider as [facility]IA did not provide me with that information and pt did not answer.

## 2022-04-14 ENCOUNTER — Ambulatory Visit (HOSPITAL_COMMUNITY): Payer: BC Managed Care – PPO

## 2022-04-14 NOTE — Telephone Encounter (Signed)
Moved appt to thur 7/27 to allow office to obtain ins auth for CMR. Pt aware and appreciative of the call Rockwell Alexandria RN Navigator Cardiac Imaging [facility] [phone] Office  [phone] Cell

## 2022-04-16 ENCOUNTER — Ambulatory Visit (HOSPITAL_COMMUNITY): Payer: BC Managed Care – PPO

## 2022-04-27 ENCOUNTER — Other Ambulatory Visit: Payer: BC Managed Care – PPO

## 2022-05-05 ENCOUNTER — Telehealth (HOSPITAL_COMMUNITY): Payer: Self-pay | Admitting: Surgery

## 2022-05-05 NOTE — Telephone Encounter (Signed)
I called patient to remind him again to complete the ordered home sleep study.  I left a message to indicate that he will be charged for the device unless he completes or returns it within 2 weeks.

## 2022-05-29 ENCOUNTER — Inpatient Hospital Stay (HOSPITAL_COMMUNITY)
Admission: RE | Admit: 2022-05-29 | Discharge: 2022-05-29 | Disposition: A | Discharge status: Home / Self Care | Payer: Self-pay | Source: Ambulatory Visit | Attending: Internal Medicine | Admitting: Internal Medicine

## 2022-05-29 NOTE — Progress Notes (Signed)
ADVANCED HEART FAILURE CONSULT NOTE     Referring Physician: Dr. Bjorn Pippin  Primary Care: [facility] Messing  Primary Cardiologist: [facility]   HPI: Referred to clinic by Dr. Bjorn Pippin, cardiology, for heart failure consultation.   Mitchell Ochoa is a 41 y/o AAM with h/o uncontrolled/ untreated HTN and newly diagnosed systolic heart failure. Diagnosed w/ HR 3/23, in setting of uncontrolled HTN. Evaluated at [facility]. Echo 3/23 EF 20-25%, RV mildly reduced. Underwent LHC 4/23 demonstrating mild nonobstructive CAD c/w NICM. Felt to be hypertensive CM w/ probable OSA. Placed on GDMT and recommended for outpatient sleep study. Of note at [facility], LDL 93 mg/dL. Hgb A1c 4.9  Recently hospitalization at [facility] 5/23 for a/c CHF in setting of dietary indiscretion and poor compliance w/ meds. Noted to be hypertensive on admit, 160s/110s. Chest CT negative for PE but showed bilateral perihilar and bibasilar ground-glass opacity, most pronounced within the right middle lobe. Findings may represent pulmonary edema versus an infectious or inflammatory process.  Diuresed w/ IV Lasix and placed back on antihypertensives/GDMT. Diuresed 17L. D/c wt 312 lb. Outpatient cMRI recommended. Referred to [facility] clinic.   Presents today for assessment. Here w/ his mother. Reports improved symptoms. Breathing/ functional capacity much improved, NYHA Class I-III. Denies orthopnea/PND. No LEE. Wt today elevated at 323 lb. No LEE. Reports full med compliance but ran out of Coreg. Has Farxiga at home but hasn't starting taking yet. Taking all other meds. BP elevated 152/112. No side effects w/ meds. Reports h/o snoring. Denies tobacco/ ETOH. Believes he may have had COVID early 2020.   He would like to switch cardiac care to [facility].     Cardiac Testing    Echocardiogram 12/15/2021 ([facility])   Left Ventricle: Left ventricle is mildly dilated.  Systolic function is severely abnormal. EF: 20-25%.   Left Atrium:  Left atrium is moderately dilated.   Right Ventricle: Right ventricle is mildly dilated. Systolic function is mildly reduced.   Aortic Valve: There is no regurgitation or stenosis.   Mitral Valve: There is mild regurgitation with a centrally directed jet.   Tricuspid Valve: The right ventricular systolic pressure is mildly elevated (37-49 mmHg).   No prior study   LHC 01/12/22 ([facility])  Hemodynamics 1. Central Aortic Pressure -147/103 mmHg 2. Left Ventricular Pressure-155/25 mmHg Additional comments on on hemodynamics: LVEDP 35 mmHg.   Coronary Angiography Antatomically right dominant No visible fluoroscopic coronary calcification  1. Left Main -large, normal.  Bifurcates distally. 2. Left anterior descending artery -large vessel proximally, moderate  vessel mid to distally.  Mild luminal irregularities primarily distally. 3. Diagonals -3 diagonal branches.  First diagonal branch is moderate in  size has minor irregularities.  Second and third diagonal  branches are  small with minor irregularities 4. Left Circumflex -anatomically nondominant.  Moderately large vessel.   20 to 30% eccentric stenosis in the proximal segment then minor diffuse  irregularities. 5. Obtuse Marginals -first marginal branch is small tortuous with minor  irregularities.  Second marginal branch is also tortuous with minor  irregularities. 6. Right Coronary Artery -anatomically dominant.  Moderately large vessel.  Generally smooth in contour.  No focal angiographic or obstructive  stenosis.  Minor irregularities primarily distally.  The distal right  coronary continues as the posterior descending artery which is small and  has diffuse irregularities. 7. Posterior Descending Artery -small-diffuse irregularities.  Left Ventriculography -not performed.  CONCLUSIONS:  Minor nonobstructive coronary artery plaque Nonischemic cardiomyopathy with severely reduced left ventricular  ejection  fraction Elevated  left ventricular end-diastolic filling pressures. Acute on chronic systolic and diastolic congestive heart failure   Review of Systems: [y] = yes, [ ]  = no   General: Weight gain [ ] ; Weight loss [ ] ; Anorexia [ ] ; Fatigue [ ] ; Fever [ ] ; Chills [ ] ; Weakness [ ]   Cardiac: Chest pain/pressure [ ] ; Resting SOB [ ] ; Exertional SOB [ ] ; Orthopnea [ ] ; Pedal Edema [ ] ; Palpitations [ ] ; Syncope [ ] ; Presyncope [ ] ; Paroxysmal nocturnal dyspnea[ ]   Pulmonary: Cough [ ] ; Wheezing[ ] ; Hemoptysis[ ] ; Sputum [ ] ; Snoring [ Y]  GI: Vomiting[ ] ; Dysphagia[ ] ; Melena[ ] ; Hematochezia [ ] ; Heartburn[ ] ; Abdominal pain [ ] ; Constipation [ ] ; Diarrhea [ ] ; BRBPR [ ]   GU: Hematuria[ ] ; Dysuria [ ] ; Nocturia[ ]   Vascular: Pain in legs with walking [ ] ; Pain in feet with lying flat [ ] ; Non-healing sores [ ] ; Stroke [ ] ; TIA [ ] ; Slurred speech [ ] ;  Neuro: Headaches[ ] ; Vertigo[ ] ; Seizures[ ] ; Paresthesias[ ] ;Blurred vision [ ] ; Diplopia [ ] ; Vision changes [ ]   Ortho/Skin: Arthritis [ ] ; Joint pain [ ] ; Muscle pain [ ] ; Joint swelling [ ] ; Back Pain [ ] ; Rash [ ]   Psych: Depression[ ] ; Anxiety[ ]   Heme: Bleeding problems [ ] ; Clotting disorders [ ] ; Anemia [ ]   Endocrine: Diabetes [ ] ; Thyroid dysfunction[ ]    Past Medical History:  Diagnosis Date   CAD (coronary artery disease) 12/2021   mild nonobstructive disease by cath ([facility])   CHF (congestive heart failure) ([facility])    Hypertension     Current Outpatient Medications  Medication Sig Dispense Refill   albuterol (VENTOLIN HFA) 108 (90 Base) MCG/ACT inhaler Inhale 1-2 puffs into the lungs every 6 (six) hours as needed for wheezing or shortness of breath. 1 each 2   atorvastatin (LIPITOR) 20 MG tablet Take 1 tablet (20 mg total) by mouth daily. 90 tablet 3   budesonide-formoterol (SYMBICORT) 160-4.5 MCG/ACT inhaler Inhale 2 puffs into the lungs in the morning and at bedtime. 10.2 g 5   carvedilol (COREG) 12.5 MG tablet Take 1 tablet (12.5 mg total)  by mouth 2 (two) times daily. 60 tablet 3   Cholecalciferol (VITAMIN D-3 PO) Take 1 capsule by mouth daily.     dapagliflozin propanediol (FARXIGA) 10 MG TABS tablet Take 1 tablet (10 mg total) by mouth daily. 30 tablet 5   furosemide (LASIX) 20 MG tablet Take 2 tablets (40 mg total) by mouth every other day. 30 tablet 3   GARLIC PO Take 1 capsule by mouth daily.     MAGNESIUM PO Take 1 tablet by mouth daily.     Multiple Vitamin (MULTIVITAMIN ADULT) TABS Take 1 tablet by mouth in the morning.     sacubitril-valsartan (ENTRESTO) 97-103 MG Take 1 tablet by mouth 2 (two) times daily. 60 tablet 5   spironolactone (ALDACTONE) 25 MG tablet Take 1 tablet (25 mg total) by mouth daily. 30 tablet 5   No current facility-administered medications for this encounter.    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    Metoprolol Tartrate Other (See Comments)    Tachycardia      Social History   Socioeconomic History   Marital status: Single    Spouse name: Not on file   Number of children: 3   Years of education: Not on file   Highest education level:  High school graduate  Occupational History   Not on file  Tobacco Use   Smoking status: Former    Years: 20.00    Types: Cigarettes    Quit date: 09/30/2021    Years since quitting: 0.6   Smokeless tobacco: Never  Vaping Use   Vaping Use: Never used  Substance and Sexual Activity   Alcohol use: Not Currently    Comment: socially   Drug use: Not Currently    Types: Marijuana    Comment: quit 09/29/21   Sexual activity: Not Currently  Other Topics Concern   Not on file  Social History Narrative   Not on file   Social Determinants of Health   Financial Resource Strain: Low Risk  (02/04/2022)   Overall Financial Resource Strain (CARDIA)    Difficulty of Paying Living Expenses: Not very hard  Food Insecurity: No Food Insecurity (02/04/2022)   Hunger Vital Sign    Worried About  Running Out of Food in the Last Year: Never true    Ran Out of Food in the Last Year: Never true  Transportation Needs: No Transportation Needs (02/04/2022)   PRAPARE - Administrator, Civil Service (Medical): No    Lack of Transportation (Non-Medical): No  Physical Activity: Insufficiently Active (02/04/2022)   Exercise Vital Sign    Days of Exercise per Week: 4 days    Minutes of Exercise per Session: 20 min  Stress: Not on file  Social Connections: Not on file  Intimate Partner Violence: Not on file      Family History  Problem Relation Age of Onset   Hypertension Mother    Hypertension Father     There were no vitals filed for this visit.   PHYSICAL EXAM: General:  Well appearing, moderately obese. No respiratory difficulty HEENT: normal Neck: supple. no JVD. Carotids 2+ bilat; no bruits. No lymphadenopathy or thryomegaly appreciated. Cor: PMI nondisplaced. Regular rate & rhythm. No rubs, gallops or murmurs. Lungs: clear Abdomen: soft, nontender, nondistended. No hepatosplenomegaly. No bruits or masses. Good bowel sounds. Extremities: no cyanosis, clubbing, rash, edema Neuro: alert & oriented x 3, cranial nerves grossly intact. moves all 4 extremities w/o difficulty. Affect pleasant.  ECG: Not performed    ASSESSMENT & PLAN:  Chronic Systolic Heart Failure - NICM, newly diagnosed - Echo 3/23 ([facility]) EF 20-25%, RV mildly reduced - LHC 4/23 ([facility]) mild nonbostructive CAD - Suspect 2/2 longstanding untreated HTN  - Chest CT 5/23 + mediastinal/ hilar lymphadenopathy  - plan cMRI  - NYHA Class I-II. Euvolemic on exam. BP remains elevated (also out of Coreg) - Restart Coreg 12.5 mg bid - Increase Entresto to 49-51 mg bid - Start Farxiga 10 mg daily  - Continue Spiro 12.5 mg daily  - Reduce Lasix to 40 mg qod  - Bidil in future if BP still elevated after optimization of Entresto/Spiro  - discussed importance of continued daily wts, strict med compliance,  sodium and fluid restriction - BMP today   - continue med titration q 2-3 weeks, until fully optimized. Plan repeat echo in 3 months  - refer to the [facility]. Assign to Dr. Gala Romney    2. Hypertension  - remains elevated - suspect etiology of CM - needs outpatient sleep study  - GDMT per above, increasing Entresto today  - suspect underlying OSA. Arrange sleep study  - consider future renal artery dopplers if BP remains elevated despite GDMT optimization   3. CAD - mild nonobstructive disease on recent  cath  - denies CP  - needs aggressive risk factor modification  - recommend low dose statin and daily ASA 81 mg   4. HLD - LP at [facility] 4/23 w/ elevated LDL 93 mg/dL. HFTs nl  - LDL goal < 70 - recommend low dose statin. Start atorva 40 mg daily  - needs repeat FLP + HFTs in 6-8 wks   5. Snoring - suspect probable OSA - arrange sleep study     NYHA I-II GDMT  Diuretic- Lasix 40 mg qod  BB- Coreg 12.5 mg bid  Ace/ARB/ARNI Entresto 49-51 mg bid  MRA Spiro 12.5 mg daily  SGLT2i Farxiga 10 mg daily   Arvilla Meres, MD  1:35 PM

## 2022-12-16 HISTORY — PX: TRANSTHORACIC ECHOCARDIOGRAM: SHX275

## 2023-02-01 ENCOUNTER — Telehealth: Payer: Self-pay

## 2023-02-01 NOTE — Telephone Encounter (Signed)
**Note De-Identified Ijanae Macapagal Obfuscation** 5/13-I have faxed clinical notes to [facility] for Gould PA.

## 2023-02-02 NOTE — Telephone Encounter (Signed)
I am not sure who ordered the sleep study. I see that the pt is followed by Dr. Gala Romney and he ordered a sleep study 2023

## 2023-02-02 NOTE — Telephone Encounter (Signed)
**Note De-Identified Mitchell Ochoa Obfuscation** 5/14-READY-No PA REQ for Mitchell Ochoa.

## 2023-02-02 NOTE — Telephone Encounter (Signed)
Unsure where this came from, Mitchell Ochoa was ordered by our office in May 2023, he was called several times and advised to complete or return device or he would be charged, he never completed and then no-showed for appt with Dr Gala Romney in Sept 2023

## 2023-03-15 IMAGING — DX DG CHEST 2V
2 series · 2 of 2 positions shown · non-contrast
Comparison: March 05, 2008.

CLINICAL DATA: Shortness of breath, cough.

EXAM:
CHEST - 2 VIEW

[chest pa]
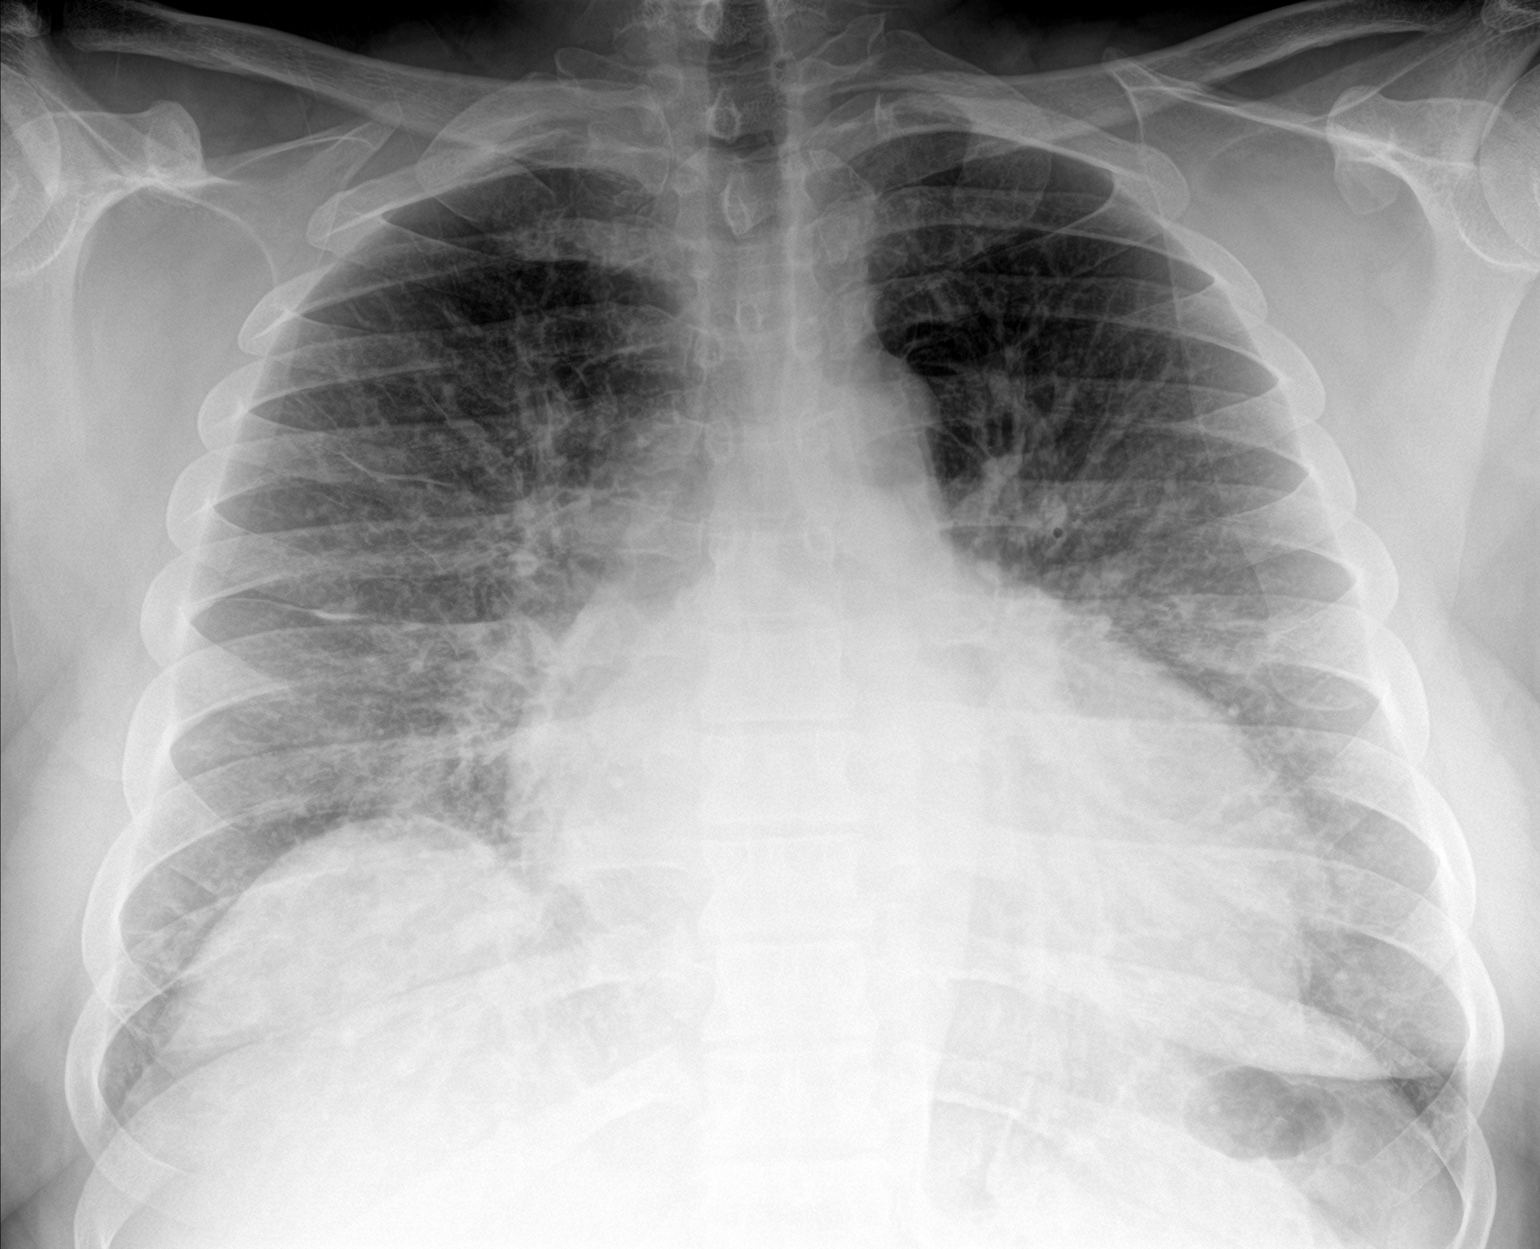

[chest lat]
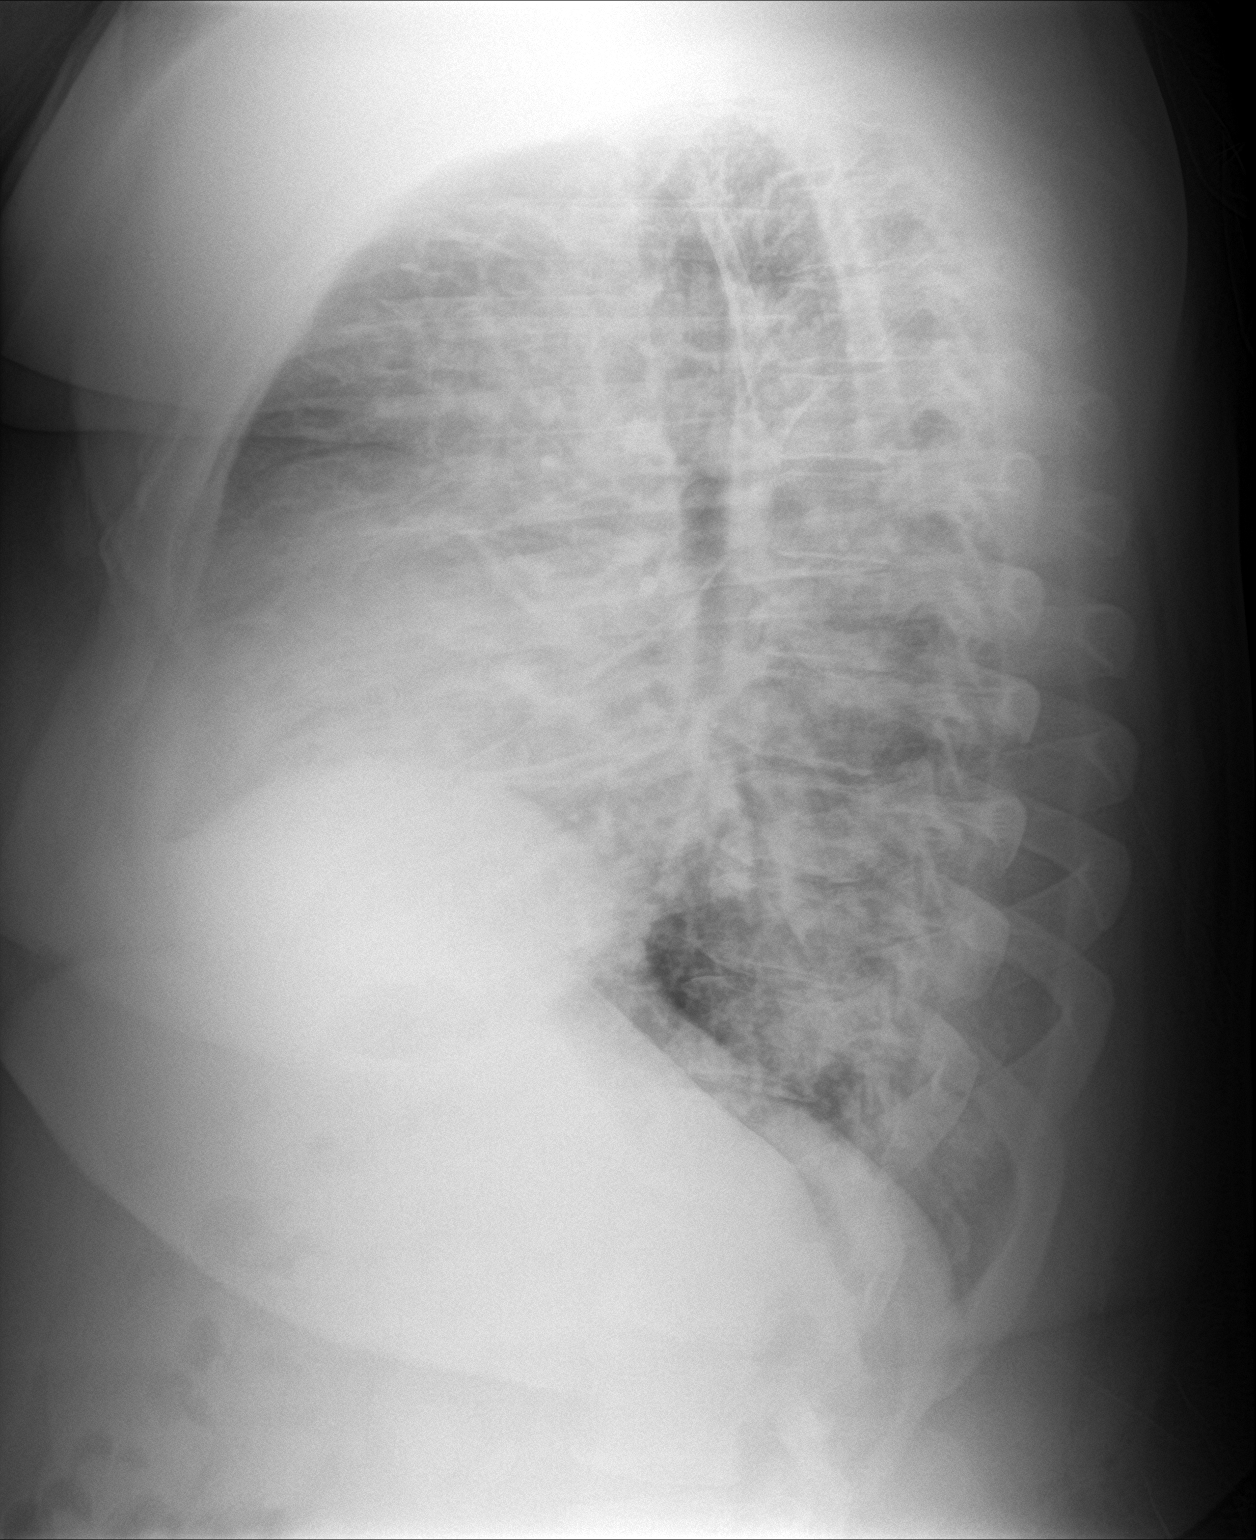

[2 of 2 positions shown; findings below may reference images not displayed]

FINDINGS: Mild cardiomegaly is noted with central pulmonary vascular
congestion. Possible minimal bilateral pulmonary edema may be
present. Both lungs are clear. The visualized skeletal structures
are unremarkable.
IMPRESSION: Mild cardiomegaly with central pulmonary vascular congestion.
Possible minimal bilateral pulmonary edema.

## 2023-04-13 ENCOUNTER — Encounter (HOSPITAL_BASED_OUTPATIENT_CLINIC_OR_DEPARTMENT_OTHER): Payer: Self-pay | Admitting: Emergency Medicine

## 2023-04-13 ENCOUNTER — Other Ambulatory Visit: Payer: Self-pay

## 2023-04-13 ENCOUNTER — Emergency Department (HOSPITAL_BASED_OUTPATIENT_CLINIC_OR_DEPARTMENT_OTHER)
Admission: EM | Admit: 2023-04-13 | Discharge: 2023-04-13 | Disposition: A | Discharge status: Home / Self Care | Payer: Worker's Compensation | Attending: Emergency Medicine | Admitting: Emergency Medicine

## 2023-04-13 ENCOUNTER — Emergency Department (HOSPITAL_BASED_OUTPATIENT_CLINIC_OR_DEPARTMENT_OTHER): Payer: Worker's Compensation

## 2023-04-13 DIAGNOSIS — W450XXA Nail entering through skin, initial encounter: Secondary | ICD-10-CM | POA: Insufficient documentation

## 2023-04-13 DIAGNOSIS — S61247A Puncture wound with foreign body of left little finger without damage to nail, initial encounter: Secondary | ICD-10-CM | POA: Insufficient documentation

## 2023-04-13 DIAGNOSIS — S6992XA Unspecified injury of left wrist, hand and finger(s), initial encounter: Secondary | ICD-10-CM | POA: Diagnosis present

## 2023-04-13 DIAGNOSIS — Y990 Civilian activity done for income or pay: Secondary | ICD-10-CM | POA: Insufficient documentation

## 2023-04-13 DIAGNOSIS — S60552A Superficial foreign body of left hand, initial encounter: Secondary | ICD-10-CM

## 2023-04-13 DIAGNOSIS — I2510 Atherosclerotic heart disease of native coronary artery without angina pectoris: Secondary | ICD-10-CM | POA: Diagnosis not present

## 2023-04-13 DIAGNOSIS — I110 Hypertensive heart disease with heart failure: Secondary | ICD-10-CM | POA: Insufficient documentation

## 2023-04-13 DIAGNOSIS — Z23 Encounter for immunization: Secondary | ICD-10-CM | POA: Insufficient documentation

## 2023-04-13 DIAGNOSIS — I509 Heart failure, unspecified: Secondary | ICD-10-CM | POA: Insufficient documentation

## 2023-04-13 DIAGNOSIS — Z79899 Other long term (current) drug therapy: Secondary | ICD-10-CM | POA: Insufficient documentation

## 2023-04-13 MED ORDER — OXYCODONE-ACETAMINOPHEN 5-325 MG PO TABS
1.0000 | ORAL_TABLET | Freq: Once | ORAL | Status: AC
  Administered 2023-04-13: 1 via ORAL
  Filled 2023-04-13: qty 1

## 2023-04-13 MED ORDER — CEPHALEXIN 500 MG PO CAPS: 500.0000 mg | ORAL_CAPSULE | Freq: Four times a day (QID) | ORAL | 0 refills | Status: DC

## 2023-04-13 MED ORDER — LIDOCAINE HCL (PF) 1 % IJ SOLN: 20.0000 mL | Freq: Once | INTRAMUSCULAR | Status: DC

## 2023-04-13 MED ORDER — TETANUS-DIPHTH-ACELL PERTUSSIS 5-2.5-18.5 LF-MCG/0.5 IM SUSY
0.5000 mL | PREFILLED_SYRINGE | Freq: Once | INTRAMUSCULAR | Status: AC
  Administered 2023-04-13: 0.5 mL via INTRAMUSCULAR
  Filled 2023-04-13: qty 0.5

## 2023-04-13 MED ORDER — LIDOCAINE HCL 1 % IJ SOLN
INTRAMUSCULAR | Status: AC
  Filled 2023-04-13: qty 20

## 2023-04-13 NOTE — ED Provider Notes (Signed)
[place] [facility] Provider Note   CSN: [id number] Arrival date & time: 04/13/23  1241     History  Chief Complaint  Patient presents with   nail in finger    Mitchell Ochoa is a 42 y.o. male history of CAD, CHF, hypertension presents today for evaluation after having a nail going through his finger.  Patient was holding the bottom of a wooden board when he was using an nail gun.  He accidentally shot a nail through his pinky finger.  No active bleeding noted.  States he had his last shot last year but he was not sure.  HPI    Past Medical History:  Diagnosis Date   CAD (coronary artery disease) 12/2021   mild nonobstructive disease by cath [facility])   CHF (congestive heart failure) (HCC)    Hypertension    History reviewed. No pertinent surgical history.   Home Medications Prior to Admission medications   Medication Sig Start Date End Date Taking? Authorizing Provider  albuterol (VENTOLIN HFA) 108 (90 Base) MCG/ACT inhaler Inhale 1-2 puffs into the lungs every 6 (six) hours as needed for wheezing or shortness of breath. 10/14/21   Mardella Layman, MD  atorvastatin (LIPITOR) 20 MG tablet Take 1 tablet (20 mg total) by mouth daily. 02/17/22   Robbie Lis M, PA-C  budesonide-formoterol (SYMBICORT) 160-4.5 MCG/ACT inhaler Inhale 2 puffs into the lungs in the morning and at bedtime. 10/16/21   Mannam, Colbert Coyer, MD  carvedilol (COREG) 12.5 MG tablet Take 1 tablet (12.5 mg total) by mouth 2 (two) times daily. 02/17/22   Allayne Butcher, PA-C  Cholecalciferol (VITAMIN D-3 PO) Take 1 capsule by mouth daily.    [provider]  dapagliflozin propanediol (FARXIGA) 10 MG TABS tablet Take 1 tablet (10 mg total) by mouth daily. 03/10/22   Bensimhon, Bevelyn Buckles, MD  furosemide (LASIX) 20 MG tablet Take 2 tablets (40 mg total) by mouth every other day. 02/17/22   Simmons, Brittainy M, PA-C  GARLIC PO Take 1 capsule by mouth daily.     [provider]  MAGNESIUM PO Take 1 tablet by mouth daily.    [provider]  Multiple Vitamin (MULTIVITAMIN ADULT) TABS Take 1 tablet by mouth in the morning.    [provider]  sacubitril-valsartan (ENTRESTO) 97-103 MG Take 1 tablet by mouth 2 (two) times daily. 03/10/22   Bensimhon, Bevelyn Buckles, MD  spironolactone (ALDACTONE) 25 MG tablet Take 1 tablet (25 mg total) by mouth daily. 03/10/22   Bensimhon, Bevelyn Buckles, MD      Allergies    Other and Metoprolol tartrate    Review of Systems   Review of Systems Negative except as per HPI.  Physical Exam Updated Vital Signs BP (!) 229/139 (BP Location: Right Wrist)   Pulse 78   Temp 99.2 F (37.3 C) (Oral)   Resp 20   Ht 6\' 2"  (1.88 m)   Wt (!) 152.9 kg   SpO2 98%   BMI 43.27 kg/m  Physical Exam Vitals and nursing note reviewed.  Constitutional:      Appearance: Normal appearance.  HENT:     Head: Normocephalic and atraumatic.     Mouth/Throat:     Mouth: Mucous membranes are moist.  Eyes:     General: No scleral icterus. Cardiovascular:     Rate and Rhythm: Normal rate and regular rhythm.     Pulses: Normal pulses.     Heart sounds: Normal heart  sounds.  Pulmonary:     Effort: Pulmonary effort is normal.     Breath sounds: Normal breath sounds.  Abdominal:     General: Abdomen is flat.     Palpations: Abdomen is soft.     Tenderness: There is no abdominal tenderness.  Musculoskeletal:        General: No deformity.     Comments: Penetrating wound on left pinkie finger with a nail in place.  Bleeding is controlled.  Skin:    General: Skin is warm.     Findings: No rash.  Neurological:     General: No focal deficit present.     Mental Status: He is alert.  Psychiatric:        Mood and Affect: Mood normal.     ED Results / Procedures / Treatments   Labs (all labs ordered are listed, but only abnormal results are displayed) Labs Reviewed - No data to display  EKG None  Radiology No  results found.  Procedures .Foreign Body Removal  Date/Time: 04/13/2023 5:36 PM  Performed by: Jeanelle Malling, PA Authorized by: Jeanelle Malling, PA  Consent: Verbal consent obtained. Written consent obtained. Consent given by: patient Patient understanding: patient states understanding of the procedure being performed Patient consent: the patient's understanding of the procedure matches consent given Procedure consent: procedure consent matches procedure scheduled Relevant documents: relevant documents present and verified Patient identity confirmed: verbally with patient  .Nerve Block  Date/Time: 04/13/2023 6:10 PM  Performed by: Jeanelle Malling, PA Authorized by: Jeanelle Malling, PA   Consent:    Consent obtained:  Verbal   Consent given by:  Patient   Risks, benefits, and alternatives were discussed: yes     Risks discussed:  Infection, bleeding and pain Universal protocol:    Procedure explained and questions answered to patient or proxy's satisfaction: yes     Relevant documents present and verified: yes     Imaging studies available: yes     Patient identity confirmed:  Verbally with patient and arm band Indications:    Indications:  Pain relief and procedural anesthesia Location:    Nerve block body site: pinkie finger.   Laterality:  Left Pre-procedure details:    Skin preparation:  Povidone-iodine Skin anesthesia:    Skin anesthesia method:  Local infiltration   Local anesthetic:  Lidocaine 1% w/o epi Procedure details:    Block needle gauge:  24 G   Anesthetic injected:  Lidocaine 1% w/o epi   Steroid injected:  None   Injection procedure:  Negative aspiration for blood, incremental injection, anatomic landmarks identified and introduced needle   Paresthesia:  None Post-procedure details:    Outcome:  Anesthesia achieved   Procedure completion:  Tolerated well, no immediate complications     Medications Ordered in ED Medications  lidocaine (PF) (XYLOCAINE) 1 % injection 20 mL (has  no administration in time range)  oxyCODONE-acetaminophen (PERCOCET/ROXICET) 5-325 MG per tablet 1 tablet (1 tablet Oral Given 04/13/23 1254)  lidocaine (XYLOCAINE) 1 % (with pres) injection (  Given 04/13/23 1309)    ED Course/ Medical Decision Making/ A&P                             Medical Decision Making Amount and/or Complexity of Data Reviewed Radiology: ordered.  Risk Prescription drug management.   This patient presents to the ED for foreign body in finger, this involves an extensive number of treatment options, and is  a complaint that carries with a high risk of complications and morbidity.  The differential diagnosis includes fracture, dislocation, retained foreign body.  This is not an exhaustive list.  Imaging studies: I ordered imaging studies, personally reviewed, interpreted imaging and agree with the radiologist's interpretations. The results include: X-ray of the left hand showed no acute osseous abnormality.   Problem list/ ED course/ Critical interventions/ Medical management: HPI: See above Vital signs within normal range and stable throughout visit. Laboratory/imaging studies significant for: See above. On physical examination, patient is afebrile and appears uncomfortable due to pain. Wound was cleaned with povidone-iodine and digital block was performed on the left pinkie. The foreign body was pulled out without difficulty. Patient tolerated the procedure well. Bleeding is controlled. Wound care discussed. I sent an rx of keflex, recommended by hand. No outpatient follow up with hand needed. TDap updated. Advised patient to take Tylenol/ibuprofen/naproxen for pain, follow-up with primary care physician for further evaluation and management, return to the ER if new or worsening symptoms. I have reviewed the patient home medicines and have made adjustments as needed.  Cardiac monitoring/EKG: The patient was maintained on a cardiac monitor.  I personally reviewed and  interpreted the cardiac monitor which showed an underlying rhythm of: sinus rhythm.  Additional history obtained: External records from outside source obtained and reviewed including: Chart review including previous notes, labs, imaging.  Consultations obtained: I spoke to Earney Hamburg hand, discussed imaging and pertinent plans.  He recommended outpatient treatment with Keflex for 7 days.  Disposition Continued outpatient therapy. Follow-up with PCP recommended for reevaluation of symptoms. Treatment plan discussed with patient.  Pt acknowledged understanding was agreeable to the plan. Worrisome signs and symptoms were discussed with patient, and patient acknowledged understanding to return to the ED if they noticed these signs and symptoms. Patient was stable upon discharge.   This chart was dictated using voice recognition software.  Despite best efforts to proofread,  errors can occur which can change the documentation meaning.          Final Clinical Impression(s) / ED Diagnoses Final diagnoses:  Metal foreign body in hand, left, initial encounter    Rx / DC Orders ED Discharge Orders     None         Jeanelle Malling, [place] 04/13/23 1819    Pricilla Loveless, MD 04/14/23 [phone]

## 2023-04-13 NOTE — ED Notes (Addendum)
PA aware of patients BP and instructed patient to follow up with PCP to adjust BP meds. Patient not having symptoms such as CP SOB or headache. Finger wrapped with xeroform, gauze and kling.

## 2023-04-13 NOTE — Discharge Instructions (Addendum)
Please take your antibiotics as prescribed. Take tylenol/ibuprofen every 6 hours for pain. I recommend close follow-up with PCP for reevaluation.  Please do not hesitate to return to emergency department if worrisome signs symptoms we discussed become apparent.  

## 2023-04-13 NOTE — ED Triage Notes (Signed)
Pt arrives from work with nail in left pinky finger.

## 2023-04-13 NOTE — ED Notes (Addendum)
Nail through right 5th digit finger tip.

## 2023-04-29 IMAGING — CR DG CHEST 2V
2 series · 2 of 2 positions shown · non-contrast
Comparison: Chest x-ray 10/14/2021, CT chest 10/30/2021

CLINICAL DATA: chest pain

EXAM:
CHEST - 2 VIEW

[chest pa]
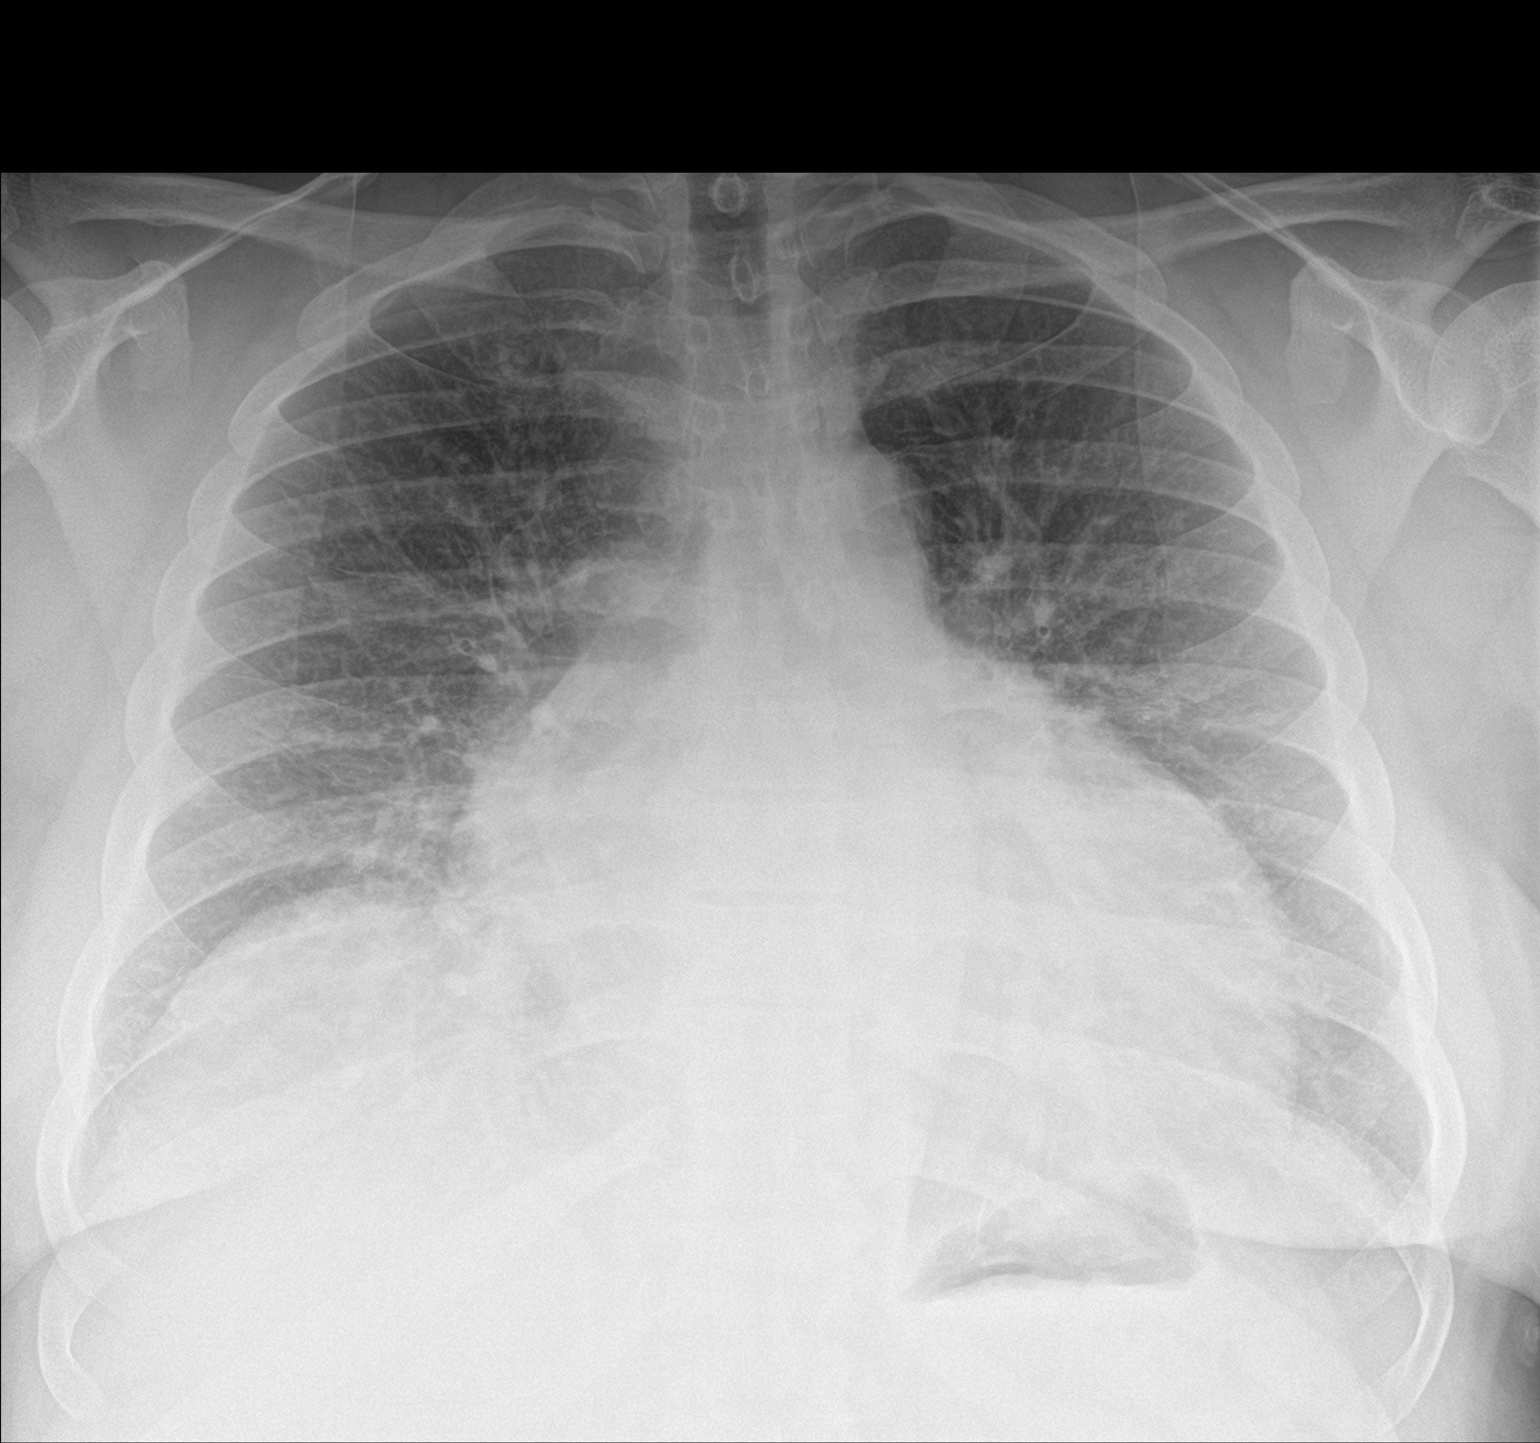

[chest lat]
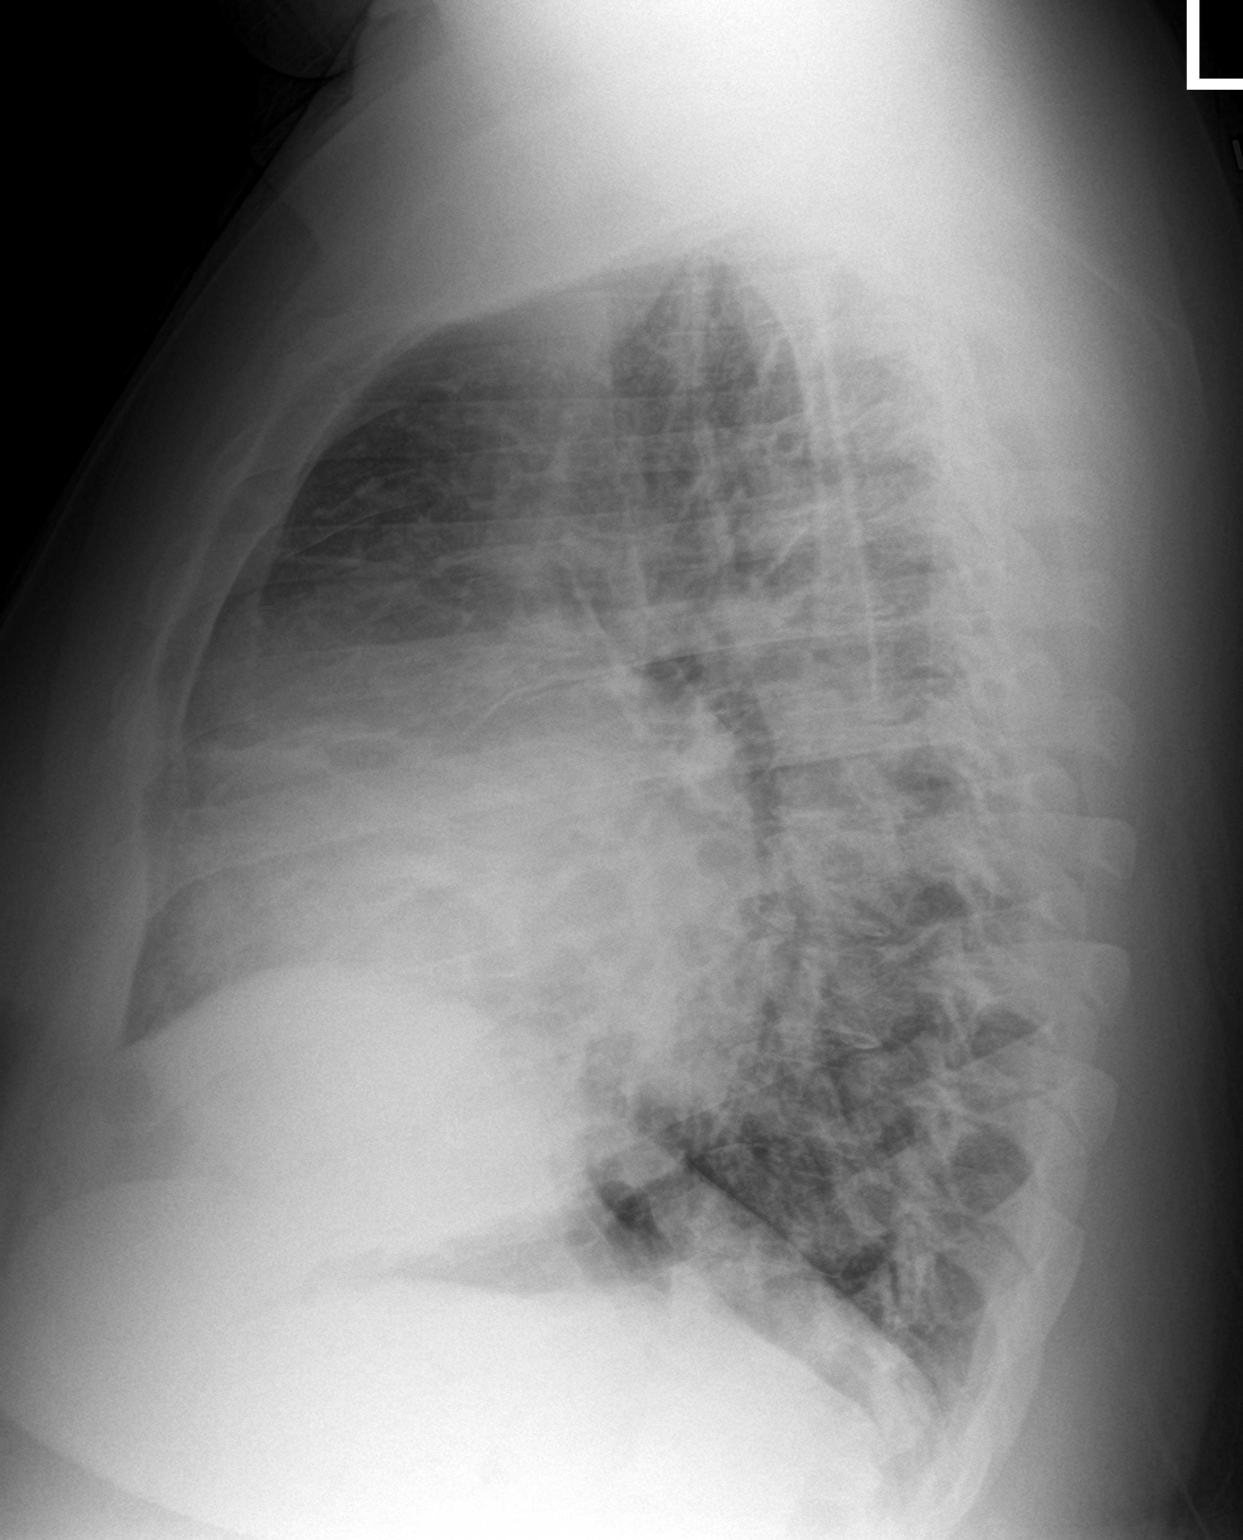

[2 of 2 positions shown; findings below may reference images not displayed]

FINDINGS: Enlarged cardiac silhouette. The heart and mediastinal contours are
unchanged.

No focal consolidation. No pulmonary edema. No pleural effusion. No
pneumothorax.

No acute osseous abnormality.
IMPRESSION: Persistent cardiomegaly with no definite active cardiopulmonary
disease.

## 2023-11-14 ENCOUNTER — Encounter: Payer: Self-pay | Admitting: Pulmonary Disease

## 2023-11-28 ENCOUNTER — Emergency Department (HOSPITAL_COMMUNITY)
Admission: EM | Admit: 2023-11-28 | Discharge: 2023-11-28 | Disposition: A | Discharge status: Home / Self Care | Payer: Self-pay | Attending: Emergency Medicine | Admitting: Emergency Medicine

## 2023-11-28 ENCOUNTER — Emergency Department (HOSPITAL_COMMUNITY): Payer: Self-pay

## 2023-11-28 ENCOUNTER — Encounter (HOSPITAL_COMMUNITY): Payer: Self-pay

## 2023-11-28 ENCOUNTER — Other Ambulatory Visit: Payer: Self-pay

## 2023-11-28 DIAGNOSIS — I5020 Unspecified systolic (congestive) heart failure: Secondary | ICD-10-CM

## 2023-11-28 DIAGNOSIS — R059 Cough, unspecified: Secondary | ICD-10-CM | POA: Insufficient documentation

## 2023-11-28 DIAGNOSIS — I509 Heart failure, unspecified: Secondary | ICD-10-CM | POA: Insufficient documentation

## 2023-11-28 DIAGNOSIS — Z79899 Other long term (current) drug therapy: Secondary | ICD-10-CM | POA: Insufficient documentation

## 2023-11-28 DIAGNOSIS — I2510 Atherosclerotic heart disease of native coronary artery without angina pectoris: Secondary | ICD-10-CM | POA: Insufficient documentation

## 2023-11-28 DIAGNOSIS — I110 Hypertensive heart disease with heart failure: Secondary | ICD-10-CM | POA: Insufficient documentation

## 2023-11-28 LAB — COMPREHENSIVE METABOLIC PANEL
ALT: 21 U/L (ref 0–44)
AST: 27 U/L (ref 15–41)
Albumin: 3.6 g/dL (ref 3.5–5.0)
Alkaline Phosphatase: 30 U/L — ABNORMAL LOW (ref 38–126)
Anion gap: 9 (ref 5–15)
BUN: 7 mg/dL (ref 6–20)
CO2: 24 mmol/L (ref 22–32)
Calcium: 9.1 mg/dL (ref 8.9–10.3)
Chloride: 107 mmol/L (ref 98–111)
Creatinine, Ser: 0.7 mg/dL (ref 0.61–1.24)
GFR, Estimated: >60 mL/min (ref >60)
Glucose, Bld: 111 mg/dL — ABNORMAL HIGH (ref 70–99)
Potassium: 4 mmol/L (ref 3.5–5.1)
Sodium: 140 mmol/L (ref 135–145)
Total Bilirubin: 1.3 mg/dL — ABNORMAL HIGH (ref 0.0–1.2)
Total Protein: 6.5 g/dL (ref 6.5–8.1)

## 2023-11-28 LAB — RESP PANEL BY RT-PCR (RSV, FLU A&B, COVID)  RVPGX2
Influenza A by PCR: NEGATIVE
Influenza B by PCR: NEGATIVE
Resp Syncytial Virus by PCR: NEGATIVE
SARS Coronavirus 2 by RT PCR: NEGATIVE

## 2023-11-28 LAB — BRAIN NATRIURETIC PEPTIDE: B Natriuretic Peptide: 401.1 pg/mL — ABNORMAL HIGH (ref 0.0–100.0)

## 2023-11-28 LAB — CBC WITH DIFFERENTIAL/PLATELET
Abs Immature Granulocytes: 0.01 10*3/uL (ref 0.00–0.07)
Basophils Absolute: 0 10*3/uL (ref 0.0–0.1)
Basophils Relative: 1 %
Eosinophils Absolute: 0.1 10*3/uL (ref 0.0–0.5)
Eosinophils Relative: 2 %
HCT: 47.9 % (ref 39.0–52.0)
Hemoglobin: 16.1 g/dL (ref 13.0–17.0)
Immature Granulocytes: 0 %
Lymphocytes Relative: 45 %
Lymphs Abs: 2.3 10*3/uL (ref 0.7–4.0)
MCH: 30.6 pg (ref 26.0–34.0)
MCHC: 33.6 g/dL (ref 30.0–36.0)
MCV: 90.9 fL (ref 80.0–100.0)
Monocytes Absolute: 0.3 10*3/uL (ref 0.1–1.0)
Monocytes Relative: 7 %
Neutro Abs: 2.2 10*3/uL (ref 1.7–7.7)
Neutrophils Relative %: 45 %
Platelets: 229 10*3/uL (ref 150–400)
RBC: 5.27 MIL/uL (ref 4.22–5.81)
RDW: 14.8 % (ref 11.5–15.5)
WBC: 5 10*3/uL (ref 4.0–10.5)
nRBC: 0 % (ref 0.0–0.2)

## 2023-11-28 LAB — TROPONIN I (HIGH SENSITIVITY)
Troponin I (High Sensitivity): 38 ng/L — ABNORMAL HIGH (ref <18)
Troponin I (High Sensitivity): 34 ng/L — ABNORMAL HIGH (ref <18)

## 2023-11-28 MED ORDER — HYDRALAZINE HCL 20 MG/ML IJ SOLN
10.0000 mg | Freq: Once | INTRAMUSCULAR | Status: AC
  Administered 2023-11-28: 10 mg via INTRAVENOUS
  Filled 2023-11-28: qty 1

## 2023-11-28 MED ORDER — FUROSEMIDE 20 MG PO TABS
20.0000 mg | ORAL_TABLET | Freq: Every day | ORAL | 3 refills | Status: DC
Start: 2023-11-28 — End: 2023-12-22

## 2023-11-28 MED ORDER — ALBUTEROL SULFATE HFA 108 (90 BASE) MCG/ACT IN AERS: 1.0000 | INHALATION_SPRAY | Freq: Four times a day (QID) | RESPIRATORY_TRACT | 2 refills | Status: AC | PRN

## 2023-11-28 MED ORDER — FUROSEMIDE 10 MG/ML IJ SOLN
20.0000 mg | Freq: Once | INTRAMUSCULAR | Status: AC
  Administered 2023-11-28: 20 mg via INTRAVENOUS
  Filled 2023-11-28: qty 2

## 2023-11-28 MED ORDER — VALSARTAN 40 MG PO TABS: 40.0000 mg | ORAL_TABLET | Freq: Every day | ORAL | 0 refills | Status: DC

## 2023-11-28 MED ORDER — IOHEXOL 350 MG/ML SOLN
75.0000 mL | Freq: Once | INTRAVENOUS | Status: AC | PRN
  Administered 2023-11-28: 75 mL via INTRAVENOUS

## 2023-11-28 NOTE — ED Triage Notes (Signed)
 Pt presents with a 1 week hx of gradual onset of ShOB that suddenly got worse this AM. He is also having some tightness in his chest and orthopnea. He noted his BP was elevated at home. He has not been on his medication in over a year and has recently started back at smoking THC "dabs" and ETOH in the past couple of weeks.

## 2023-11-28 NOTE — ED Provider Notes (Signed)
 [place] [facility] AT [facility] Provider Note   CSN: [id number] Arrival date & time: 11/28/23  1220     History {Add pertinent medical, surgical, social history, OB history to HPI:1} Chief Complaint  Patient presents with   Shortness of Breath    Mitchell Ochoa is a 43 y.o. male.  Patient with history of hypertension, CHF, CAD presents today with complaints of shortness of breath. He states that same began last week after he smoked marijuana and drank a few beers. He does not normally drink or smoke. States that he has not been taking any of his medications for about a year now because he was no longer able to afford them.  This includes Entresto, carvedilol, Lasix, and spironolactone.  He states that he had been doing fine since discontinuing this medication as he had been adherent to a more healthy and active lifestyle.  States that just a few weeks ago he was even playing basketball.  Since last week he has been unable to walk to the bathroom without getting significantly winded.  States this feels like heart failure exacerbations he has had previously.  However, he states that he does not feel fluid overloaded like he has before.  Specifically, his legs and abdomen are not as swollen as they usually are when this happens.  He does note that he has had a cough as well.  Notes that he saw some blood-tinged sputum this morning.  Denies any recent travel or surgeries.  Does state that he is having to sleep in a recliner because he cannot lay flat.  He is also waking up short of breath. Denies chest pain.  The history is provided by the patient. No language interpreter was used.  Shortness of Breath Associated symptoms: cough        Home Medications Prior to Admission medications   Medication Sig Start Date End Date Taking? Authorizing Provider  albuterol (VENTOLIN HFA) 108 (90 Base) MCG/ACT inhaler Inhale 1-2 puffs into the lungs every 6 (six) hours as needed  for wheezing or shortness of breath. 10/14/21   Mardella Layman, MD  atorvastatin (LIPITOR) 20 MG tablet Take 1 tablet (20 mg total) by mouth daily. 02/17/22   Robbie Lis M, PA-C  budesonide-formoterol (SYMBICORT) 160-4.5 MCG/ACT inhaler Inhale 2 puffs into the lungs in the morning and at bedtime. 10/16/21   Mannam, Colbert Coyer, MD  carvedilol (COREG) 12.5 MG tablet Take 1 tablet (12.5 mg total) by mouth 2 (two) times daily. 02/17/22   Robbie Lis M, PA-C  cephALEXin (KEFLEX) 500 MG capsule Take 1 capsule (500 mg total) by mouth 4 (four) times daily. 04/13/23   Jeanelle Malling, PA  Cholecalciferol (VITAMIN D-3 PO) Take 1 capsule by mouth daily.    [provider]  dapagliflozin propanediol (FARXIGA) 10 MG TABS tablet Take 1 tablet (10 mg total) by mouth daily. 03/10/22   Bensimhon, Bevelyn Buckles, MD  furosemide (LASIX) 20 MG tablet Take 2 tablets (40 mg total) by mouth every other day. 02/17/22   Simmons, Brittainy M, PA-C  GARLIC PO Take 1 capsule by mouth daily.    [provider]  MAGNESIUM PO Take 1 tablet by mouth daily.    [provider]  Multiple Vitamin (MULTIVITAMIN ADULT) TABS Take 1 tablet by mouth in the morning.    [provider]  sacubitril-valsartan (ENTRESTO) 97-103 MG Take 1 tablet by mouth 2 (two) times daily. 03/10/22   Bensimhon, Bevelyn Buckles, MD  spironolactone (ALDACTONE) 25  MG tablet Take 1 tablet (25 mg total) by mouth daily. 03/10/22   Bensimhon, Bevelyn Buckles, MD      Allergies    Other and Metoprolol tartrate    Review of Systems   Review of Systems  Respiratory:  Positive for cough and shortness of breath.   All other systems reviewed and are negative.   Physical Exam Updated Vital Signs BP (!) 179/136   Pulse 91   Temp 98.5 F (36.9 C)   Resp (!) 28   Ht 6\' 2"  (1.88 m)   Wt (!) 155.1 kg   SpO2 94%   BMI 43.91 kg/m  Physical Exam Vitals and nursing note reviewed.  Constitutional:      General: He is not in acute distress.     Appearance: Normal appearance. He is normal weight. He is not ill-appearing, toxic-appearing or diaphoretic.  HENT:     Head: Normocephalic and atraumatic.  Cardiovascular:     Rate and Rhythm: Normal rate and regular rhythm.     Heart sounds: Normal heart sounds.  Pulmonary:     Effort: Pulmonary effort is normal. Tachypnea present. No respiratory distress.     Breath sounds: Rales present.  Abdominal:     Palpations: Abdomen is soft.     Tenderness: There is no abdominal tenderness.  Musculoskeletal:        General: Normal range of motion.     Cervical back: Normal range of motion.     Right lower leg: No tenderness. No edema.     Left lower leg: No tenderness. No edema.  Skin:    General: Skin is warm and dry.  Neurological:     General: No focal deficit present.     Mental Status: He is alert.  Psychiatric:        Mood and Affect: Mood normal.        Behavior: Behavior normal.     ED Results / Procedures / Treatments   Labs (all labs ordered are listed, but only abnormal results are displayed) Labs Reviewed  COMPREHENSIVE METABOLIC PANEL - Abnormal; Notable for the following components:      Result Value   Glucose, Bld 111 (*)    Alkaline Phosphatase 30 (*)    Total Bilirubin 1.3 (*)    All other components within normal limits  BRAIN NATRIURETIC PEPTIDE - Abnormal; Notable for the following components:   B Natriuretic Peptide 401.1 (*)    All other components within normal limits  TROPONIN I (HIGH SENSITIVITY) - Abnormal; Notable for the following components:   Troponin I (High Sensitivity) 38 (*)    All other components within normal limits  CBC WITH DIFFERENTIAL/PLATELET  TROPONIN I (HIGH SENSITIVITY)    EKG None  Radiology DG Chest 2 View Result Date: 11/28/2023 CLINICAL DATA:  Shortness of breath EXAM: CHEST - 2 VIEW COMPARISON:  April 09, 2022 FINDINGS: The cardiomediastinal silhouette is unchanged and markedly enlarged in contour. No pleural effusion. No  pneumothorax. Perihilar vascular fullness with peribronchial cuffing and interstitial markings. Visualized abdomen is unremarkable. Mild degenerative changes of the thoracic spine. IMPRESSION: Constellation of findings are consistent with cardiomegaly with pulmonary edema. Electronically Signed   By: Meda Klinefelter M.D.   On: 11/28/2023 14:05    Procedures Procedures  {Document cardiac monitor, telemetry assessment procedure when appropriate:1}  Medications Ordered in ED Medications  hydrALAZINE (APRESOLINE) injection 10 mg (10 mg Intravenous Given 11/28/23 1445)  furosemide (LASIX) injection 20 mg (20 mg Intravenous Given 11/28/23  1444)    ED Course/ Medical Decision Making/ A&P   {   Click here for ABCD2, HEART and other calculatorsREFRESH Note before signing :1}                              Medical Decision Making Amount and/or Complexity of Data Reviewed Radiology: ordered.  Risk Prescription drug management.   This patient is a 43 y.o. male who presents to the ED for concern of shortness of breath, this involves an extensive number of treatment options, and is a complaint that carries with it a high risk of complications and morbidity. The emergent differential diagnosis prior to evaluation includes, but is not limited to,  CHF, pericardial effusion/tamponade, arrhythmias, ACS, COPD, asthma, bronchitis, pneumonia, pneumothorax, PE, anemia   This is not an exhaustive differential.   Past Medical History / Co-morbidities / Social History:  has a past medical history of CAD (coronary artery disease) (12/2021), CHF (congestive heart failure) (HCC), and Hypertension.  Additional history: Chart reviewed. Pertinent results include: Patient is supposed to be taking Entresto, Lasix, spironolactone, and carvedilol for his hypertension and CHF.  It looks like the last echocardiogram he he had in 2023 showed an EF of 20 to 25%.  Physical Exam: Physical exam performed. The pertinent  findings include: mild dyspnea noted, able to speak in complete sentences.   Lab Tests: I ordered, and personally interpreted labs.  The pertinent results include:  Troponin 38, consistent with previous, likely due to hypertension. BNP 401   Imaging Studies: I ordered imaging studies including CXR. I independently visualized and interpreted imaging which showed   Constellation of findings are consistent with cardiomegaly with pulmonary edema.  I agree with the radiologist interpretation.   Cardiac Monitoring:  The patient was maintained on a cardiac monitor.  Cardiac monitor showed an underlying rhythm of: sinus rhythm, no STEMI. I agree with this interpretation.   Medications: I ordered medication including Lasix, hydralazine  for CHF, hypertension. Reevaluation of the patient after these medicines is pending at shift change.   Disposition:  Patients CT is pending at shift change and will determine dispo. If negative, suspect patient will require admission for diuresis and additional cardiac evaluation including echocardiogram as patient has been essentially lost to follow-up for the past year.   Care handoff to Langston Masker, PA-C at shift change. Please see their note for continued evaluation and dispo  Final Clinical Impression(s) / ED Diagnoses Final diagnoses:  None    Rx / DC Orders ED Discharge Orders     None

## 2023-11-28 NOTE — ED Notes (Signed)
 Questions and concerns addressed. Discharge teaching completed.   Prescriptions reviewed and pharmacy verified.   Pt ambulatory upon discharge.

## 2023-11-28 NOTE — ED Provider Notes (Signed)
 Patient's care assumed by me at 3:30 PM.  Patient has a past medical history of congestive heart failure.  He has had increasing shortness of breath.  Patient states he has had increasing shortness of breath.  Patient is pending CT angio and troponin.  Patient has been given Lasix here. Patient CT angio shows no evidence of pulmonary embolus BNP is 401.  Troponin is 34 and 38.  Patient does have chronic troponin elevation. Patient is reevaluated.  He has urinated 3 L of fluid.  Patient has dressed himself and is ready to go home stating he feels much better.. I discussed patient's situation with him he states that he was doing well when he was going to the heart failure clinic.  Patient states that he began drinking alcohol and using marijuana and stopped taking his medications.  Patient states he wants to return to the heart failure clinic and is attempting to make lifestyle changes.  Patient is here with his girlfriend who is supportive.  I reviewed patient's medications he is given a prescription for Lasix, albuterol and valsartan.  Patient has been managed in the past with [facility] and will discuss this with the heart failure clinic.  I placed a referral to cardiology and gave him the phone number for the heart failure clinic for him to contact tomorrow.  Patient agrees to return if any increased shortness of breath or chest pain.  Patient is discharged in stable condition.    Elson Areas, [place] 11/28/23 1947    Loetta Rough, MD 11/28/23 [phone]

## 2023-11-28 NOTE — ED Provider Triage Note (Signed)
 Emergency Medicine Provider Triage Evaluation Note  Mitchell Ochoa , a 43 y.o. male  was evaluated in triage.  Pt complains of SOB and chest pressure for the past several days.  Patient reports he has a history of hypertension and has not been taking his blood pressure medications in about a year.  No headaches or vision changes.  Review of Systems  Positive: CP, SOB Negative: Fever   Physical Exam  BP (!) 188/146   Pulse 96   Temp 98.5 F (36.9 C)   Resp (!) 22   Ht 6\' 2"  (1.88 m)   Wt (!) 155.1 kg   SpO2 98%   BMI 43.91 kg/m  Gen:   Awake, no distress   Resp:  Normal effort  MSK:   Moves extremities without difficulty  Other:    Medical Decision Making  Medically screening exam initiated at 12:58 PM.  Appropriate orders placed.  Mitchell Ochoa was informed that the remainder of the evaluation will be completed by another provider, this initial triage assessment does not replace that evaluation, and the importance of remaining in the ED until their evaluation is complete.    Maxwell Marion, PA-C 11/28/23 1309

## 2023-11-28 NOTE — Discharge Instructions (Addendum)
 Schedule to be seen in the heart failure clinic.  Return to the emergency department if you begin feeling more short of breath.  Take the Lasix daily.  The heart failure clinic will reevaluate your medications and provide further treatment.

## 2023-12-21 NOTE — Progress Notes (Signed)
 Cardiology Office Note:  .   Date:  12/26/2023  ID:  Mitchell Ochoa, DOB September 24, 1980, MRN [id number] PCP: Modesta Messing  [facility] Providers Cardiologist:  Bryan Lemma, MD     Chief Complaint  Patient presents with   Follow-up    Need to reestablish cardiology care   Cardiomyopathy   Congestive Heart Failure    Symptoms got much worse after he was off medications and then went back to drinking and smoking marijuana.    Patient Profile: .     Mitchell Ochoa is an obese 43 y.o. male former (tobacco & marijuana) smoker with a PMH notable for severe nonischemic cardiomyopathy diagnosed 2023 who presents here for ER follow-up/Establish Cardiology Care at the request of Modesta Messing from [facility] ER.  Nonischemic cardiomyopathy-EF 2025% by echo with minimal CAD (11/2021) Was actually referred to Dr. Gala Romney in the advanced heart failure clinic in September 2023-did not show for appointment.     [name] was last seen at [facility] in April 2023-seen by Pandora Leiter, MD.  Initial presentation was worsening dyspnea found to have severely reduced EF.  Started on oral diuretics, beta-blocker, ACE inhibitor and spironolactone.  He was then referred for cardiac catheterization reviewed below that showed severely elevated LVEDP with minimal CAD.  Clearly had an issue of compliance-not taking Lasix for several days prior to the visit noted that he was starting wheezing.  Felt much better after taking Lasix.  Had significant elevated blood pressure in the 160s.  Not monitoring his diet as far as low-salt.  Noted to have 1+ pitting edema and 1+ pulses and mildly decreased breath sounds. Cardiac MRI ordered; Lasix 20 mg twice daily renewed,  Coreg increased to 12.5 mg twice daily, Aldactone continued Lisinopril stopped with plans to start Raford Pitcher Farxiga 5 mg with plan to titrate to 10. Discussed LifeVest-but patient was  not interested.  He was actually seen by Boyce Medici, PA on 02/17/2022 in the Advanced Heart Failure Clinic ((referred by Dr. Bufford Buttner was rounded on by Dr. Shari Prows and Dr. Bjorn Pippin) after a brief hospitalization at [facility] for CHF in the setting of dietary indiscretion and poor medication adherence. => He was diuresed and placed back on antihypertensives.  After 17 L diuresis referred to [facility] clinic and cardiac MRI recommended.  Noted that his functional capacity is much improved but still with NYHA class II symptoms.  Apparently ran out of Coreg and had not yet started taking [place].  Blood pressure was 152/112.  Indicated desire to switch care to [facility].  NYHA I-II: GDMT Coreg 12.5 mg twice daily, Entresto 49-51 mg twice daily, Spyro 12.5 mg daily, Farxiga 10 mg daily, Lasix 40 mg every other day. Noted in the chest CT on 5/23 showed mediastinal and hilar lymphadenopathy => cardiac MRI planned Restarted Coreg 12.5 mg twice daily and increased Entresto to 49-51 mg twice daily.  Restarted Farxiga 10 mg daily and continue spironolactone 12.5 mg daily. Reduced Lasix to 40 mg every other day Plan was to see him a couple weeks with medication titration and follow-up in the [facility] with Dr. Gala Romney Plans for sleep study. Started atorvastatin 40 mg daily  No-show for follow-ups including a no-show with Dr. Teena Dunk on 05/29/2022.  He was seen at [facility] ER 11/28/2023 with symptoms of CHF. => Given IV Lasix diuresed 3 L of fluid.  He got dressed and 71 to go home.  Stating he felt much better.  He indicated that he was drinking alcohol and using marijuana and stopped taking his medications.  He wanted to return to the heart rate clinic and was going to make lifestyle changes.  Supposedly supportive girlfriend was present.  Was given prescription for Lasix albuterol and valsartan referral placed to cardiology.  He presents here today for cardiology despite the fact that he has already had a  cardiologist in our group.  Subjective  Discussed the use of AI scribe software for clinical note transcription with the patient, who gave verbal consent to proceed.  History of Present Illness History of Present Illness Mitchell Ochoa is a 43 year old male with heart failure who presents with worsening symptoms of heart failure.  He has experienced worsening symptoms of heart failure, including shortness of breath, erratic heartbeat, and difficulty sleeping flat. These symptoms have returned after a period of improvement when he was on his medications. Previously, he could engage in physical activities such as playing basketball, but now experiences shortness of breath with minimal exertion, such as walking to the mailbox or washing dishes.  He was last seen in the advanced heart failure clinic about two years ago and has not been on his prescribed medications, including Entresto, for approximately a year. He wants to resume Entresto, which he felt significantly improved his condition. Currently, he is taking valsartan 40 mg daily and Lasix 20 mg daily (furosemide), but notes that he only experiences significant diuresis when taking two tablets of Lasix.  He reports a weight gain from 323 pounds in May 2023 to 360 pounds currently, attributing some of this to fluid retention. He notes that when he takes Lasix and effectively removes fluid, he can lay flat and breathe easier. He has experienced swelling in his legs, previously severe enough to prevent him from wearing shoes, but this has improved slightly.  He had previously increased alcohol consumption and smoking marijuana starting in January 2025, which coincided with the return of his symptoms. He describes drinking 'a couple of twelve packs' with friends and smoking pot as part of celebratory activities. He has since stopped both alcohol and marijuana use.  No dizziness, lightheadedness, or syncope. He experiences chest pressure and  difficulty breathing during physical activities, but notes that it has improved since getting back on medications.  He has noted significant PND and orthopnea as well edema.  Thankfully, besides some skipping heartbeats no rapid irregular heart rhythms or syncope/near syncope.  No TIA or amaurosis fugax.     Objective   Current Meds Include Albuterol Inhaler, Furosemide 20 mL Daily and Valsartan 40 Mg Daily.  Studies Reviewed: Marland Kitchen   EKG Interpretation Date/Time:  Wednesday December 22 2023 08:06:09 EDT Ventricular Rate:  95 PR Interval:  164 QRS Duration:  96 QT Interval:  364 QTC Calculation: 457 R Axis:   112  Text Interpretation: Normal sinus rhythm Right atrial enlargement Right axis deviation When compared with ECG of 28-Nov-2023 12:32, QRS axis Shifted right Confirmed by Bryan Lemma ([id number]) on 12/22/2023 8:16:30 AM    [place]: PE protocol CTA (11/28/2023: No PE.  Cardiomegaly with contrast reflux into hepatic veins and IVC suggesting of Rh dysfunction.  Heterogeneous groundglass opacity and occasional areas of subpleural thickening/groundglass nodularity suggesting pulmonary tumor versus atypical infection.  Shotty/mildly enlarged mediastinal and hilar LN.  Likely reactive.  Send aorta measuring 4.2 cm.  Lab Results  Component Value Date   NA 140 11/28/2023   K 4.0 11/28/2023  CREATININE 0.70 11/28/2023   GFRNONAA >60 11/28/2023   GLUCOSE 111 (H) 11/28/2023   No results found for: "CHOL", "HDL", "LDLCALC", "LDLDIRECT", "TRIG", "CHOLHDL"    Latest Ref Rng & Units 11/28/2023   12:51 PM 04/10/2022    2:41 AM 04/10/2022   12:53 AM  CBC  WBC 4.0 - 10.5 K/uL 5.0     Hemoglobin 13.0 - 17.0 g/dL 16.1  09.6  04.5   Hematocrit 39.0 - 52.0 % 47.9  56.0  57.0   Platelets 150 - 400 K/uL 229      Studies from [facility]: ECHO 2022/01/01: Mildly dilated LV with severely reduced function-EF 20 to 25%.  Indeterminate diastolic parameters.  Moderate LA dilation and mild RV dilation with  mildly reduced function.  Normal AoV.  Mild MR.  Mildly elevated RVSP and severely dilated RA P CATH 01/12/2022: LVEDP 35 mmHg.  Minor nonobstructive CAD.   LCA: LM bifurcates into LAD and LCx -<LAD is a large vessel proximally and tapers to a moderate minute distally.  Mild irregularities.  3 diagonal branches moderate D1 and small D2 and D3; moderate-large nondominant LCx-proximal 20-30% otherwise mild diffuse disease.  Small tortuous OM1 OM 2 was also tortuous mild irregularities.  Moderate-large caliber dominant RCA with no significant disease- <bifurcates distally into small diffusely diseased PDA Labs from 01/01/2022: TC 153, TG 108, HDL 40, LDL 93.  Hgb 17.3; BUN 9/0.75.  K+ 3.9  Risk Assessment/Calculations:        Physical Exam:   VS:  BP 126/82   Pulse 91   Ht 6\' 2"  (1.88 m)   Wt (!) 359 lb 6.4 oz (163 kg)   SpO2 98%   BMI 46.14 kg/m    Wt Readings from Last 3 Encounters:  12/22/23 (!) 359 lb 6.4 oz (163 kg)  11/28/23 (!) 345 lb (156.5 kg)  04/13/23 (!) 337 lb (152.9 kg)    GEN: Well nourished, well groomed in no acute distress; non-toxic NECK: ++ JVD ~ 12 cm H20; No carotid bruits CARDIAC: Distant S1, S2; RRR, no murmurs, rubs; Summation S3/S4 gallop RESPIRATORY: Distant heart sounds with mostly clear to auscultation without rales, wheezing or rhonchi ; nonlabored, good air movement. ABDOMEN: Soft, non-tender, non-distended EXTREMITIES:  No edema; No deformity        ASSESSMENT AND PLAN: .    Problem List Items Addressed This Visit       Cardiology Problems   Chronic combined systolic and diastolic CHF (congestive heart failure) (HCC) - Primary (Chronic)   Heart failure with reduced ejection fraction (HFrEF) HFrEF symptoms notably improved while on GDMT medications to the point where for the last year he was doing well.  However symptoms recurred after discontinuation of medications, exacerbated by substance use. Fluid retention and weight gain suggest cardiac function  deterioration. - Has not had recent echo evaluation.  - Order echocardiogram to assess current ejection fraction. - Increase furosemide to 40 mg daily with additional dose if weight increases by more than 3 pounds in a day. - Start spironolactone 12.5 mg daily. - Start carvedilol 6.25 mg BID after a few days of increased furosemide and spironolactone. - Recheck chemistry panel and lipids in 2 weeks. - Schedule follow-up in 3 weeks with APP to further titrate medications. - Plan to switch from valsartan to [facility] after ensuring tolerance to current medications. - Advise on low-sodium diet to prevent fluid retention.      Relevant Medications   carvedilol (COREG) 6.25 MG tablet  spironolactone (ALDACTONE) 25 MG tablet   furosemide (LASIX) 40 MG tablet   Other Relevant Orders   Lipid panel   Comprehensive metabolic panel with GFR   ECHOCARDIOGRAM COMPLETE   Hyperlipidemia due to dietary fat intake (Chronic)   Hyperlipidemia requires reassessment and potential treatment adjustment. - Recheck lipid panel in 2 weeks. - Restart cholesterol medication based on lipid panel results.      Relevant Medications   carvedilol (COREG) 6.25 MG tablet   spironolactone (ALDACTONE) 25 MG tablet   furosemide (LASIX) 40 MG tablet   Nonischemic cardiomyopathy (HCC) (Chronic)   Plan was for cardiac MRI to evaluate etiology.  Was nonischemic based on normal coronaries.  Did have significant alcohol use which could explain his Cornelius Moras as well as hypertension.  Would like to simply reassess with echocardiogram to get a better sense of his current EF.      Relevant Medications   carvedilol (COREG) 6.25 MG tablet   spironolactone (ALDACTONE) 25 MG tablet   furosemide (LASIX) 40 MG tablet   Other Relevant Orders   EKG 12-Lead (Completed)   Lipid panel   Comprehensive metabolic panel with GFR   ECHOCARDIOGRAM COMPLETE   NSVT (nonsustained ventricular tachycardia) (HCC) (Chronic)   No further symptoms  irregular heartbeats.  This was in the setting of significant ECF.  Will need to eventually get him back on beta-blocker. -Start carvedilol 6.25 mg twice daily      Relevant Medications   carvedilol (COREG) 6.25 MG tablet   spironolactone (ALDACTONE) 25 MG tablet   furosemide (LASIX) 40 MG tablet   Primary hypertension (Chronic)   Hypertension well-managed with valsartan.  Optimizing heart failure management will aid blood pressure control. - Continue valsartan at current dose while we add carvedilol 6.25 mg twice daily and spironolactone 12.5 mg daily - If BP tolerates in the future would plan to go back to low-dose Entresto from valsartan. - Monitor blood pressure as medications are adjusted.      Relevant Medications   carvedilol (COREG) 6.25 MG tablet   spironolactone (ALDACTONE) 25 MG tablet   furosemide (LASIX) 40 MG tablet     Other   Substance abuse (HCC) (Chronic)   Substance use exacerbated heart failure symptoms. Abstinence is crucial. - Advise to abstain from alcohol and marijuana to prevent exacerbation of heart failure symptoms.        Follow-Up: Return in about 3 weeks (around 01/12/2024) for Follow-up with Marjie Skiff, PA;, 3-4 month follow-up with me, [employer].   Recording duration: 32 minutes Total time spent: 32 min spent with patient + 37 spent charting = 69 min I spent 69 minutes in the care of Mitchell Ochoa today including reviewing labs (1 minute), reviewing outside studies (cardiac cath and echo reviewed-5 minutes), face to face time discussing treatment options (32 minutes), reviewing records from combination of [facility] notes x 2, [facility] and hospitalization notes (15 minutes), 9 minutes dictating, and documenting in the encounter. The patient has pretty significant history over the last several years then was with loss of follow-up.  Multiple clinic notes, hospital notes and studies  reviewed.    Signed, Marykay Lex, MD, MS Bryan Lemma, M.D., M.S. Interventional Cardiologist  [facility]  Pager # [phone] Phone # [phone] [address] [place], [place] [address]

## 2023-12-22 ENCOUNTER — Encounter: Payer: Self-pay | Admitting: Cardiology

## 2023-12-22 ENCOUNTER — Ambulatory Visit: Payer: Self-pay | Attending: Cardiology | Admitting: Cardiology

## 2023-12-22 VITALS — BP 126/82 | HR 91 | Ht 74.0 in | Wt 359.4 lb

## 2023-12-22 DIAGNOSIS — E7849 Other hyperlipidemia: Secondary | ICD-10-CM

## 2023-12-22 DIAGNOSIS — F1910 Other psychoactive substance abuse, uncomplicated: Secondary | ICD-10-CM

## 2023-12-22 DIAGNOSIS — I5042 Chronic combined systolic (congestive) and diastolic (congestive) heart failure: Secondary | ICD-10-CM

## 2023-12-22 DIAGNOSIS — I10 Essential (primary) hypertension: Secondary | ICD-10-CM

## 2023-12-22 DIAGNOSIS — I4729 Other ventricular tachycardia: Secondary | ICD-10-CM

## 2023-12-22 DIAGNOSIS — I428 Other cardiomyopathies: Secondary | ICD-10-CM

## 2023-12-22 MED ORDER — FUROSEMIDE 40 MG PO TABS: 40.0000 mg | ORAL_TABLET | Freq: Every day | ORAL | 3 refills | Status: DC

## 2023-12-22 MED ORDER — CARVEDILOL 6.25 MG PO TABS: 6.2500 mg | ORAL_TABLET | Freq: Two times a day (BID) | ORAL | 3 refills | Status: AC

## 2023-12-22 MED ORDER — SPIRONOLACTONE 25 MG PO TABS: 12.5000 mg | ORAL_TABLET | Freq: Every day | ORAL | 3 refills | Status: AC

## 2023-12-22 NOTE — Patient Instructions (Addendum)
 Medication Instructions:    Increase Lasix ( furosemide)  40 mg  daily may take an extra 40 mg if weight gain is 3 lb or more over night.  Start Spironolactone 12.5 mg  daily  ( 1/2 tablet of 25 mg ) Start carvedilol 6.25 mg  twice a day   *If you need a refill on your cardiac medications before your next appointment, please call your pharmacy*   Lab Work: fasting  either Next Thursday  or Friday  4/10 or 4/11 CMP Lipid  If you have labs (blood work) drawn today and your tests are completely normal, you will receive your results only by: MyChart Message (if you have MyChart) OR A paper copy in the mail If you have any lab test that is abnormal or we need to change your treatment, we will call you to review the results.   Testing/Procedures:  Will be schedule at [address] or [address] Your physician has requested that you have an echocardiogram. Echocardiography is a painless test that uses sound waves to create images of your heart. It provides your doctor with information about the size and shape of your heart and how well your heart's chambers and valves are working. This procedure takes approximately one hour. There are no restrictions for this procedure. Please do NOT wear cologne, perfume, aftershave, or lotions (deodorant is allowed). Please arrive 15 minutes prior to your appointment time.  Please note: We ask at that you not bring children with you during ultrasound (echo/ vascular) testing. Due to room size and safety concerns, children are not allowed in the ultrasound rooms during exams. Our front office staff cannot provide observation of children in our lobby area while testing is being conducted. An adult accompanying a patient to their appointment will only be allowed in the ultrasound room at the discretion of the ultrasound technician under special circumstances. We apologize for any inconvenience.   Follow-Up: At [facility], you and your  health needs are our priority.  As part of our continuing mission to provide you with exceptional heart care, we have created designated Provider Care Teams.  These Care Teams include your primary Cardiologist (physician) and Advanced Practice Providers (APPs -  Physician Assistants and Nurse Practitioners) who all work together to provide you with the care you need, when you need it.     Your next appointment:   3 week(s)  The format for your next appointment:   In Person  Provider:        Bernadene Person NP or Charlies Silvers PA  Other Instructions   s

## 2023-12-26 ENCOUNTER — Encounter: Payer: Self-pay | Admitting: Cardiology

## 2023-12-26 DIAGNOSIS — F1910 Other psychoactive substance abuse, uncomplicated: Secondary | ICD-10-CM | POA: Insufficient documentation

## 2023-12-26 DIAGNOSIS — E7849 Other hyperlipidemia: Secondary | ICD-10-CM | POA: Insufficient documentation

## 2023-12-26 NOTE — Assessment & Plan Note (Signed)
 Plan was for cardiac MRI to evaluate etiology.  Was nonischemic based on normal coronaries.  Did have significant alcohol use which could explain his Mitchell Ochoa as well as hypertension.  Would like to simply reassess with echocardiogram to get a better sense of his current EF.

## 2023-12-26 NOTE — Assessment & Plan Note (Signed)
 Substance use exacerbated heart failure symptoms. Abstinence is crucial. - Advise to abstain from alcohol and marijuana to prevent exacerbation of heart failure symptoms.

## 2023-12-26 NOTE — Assessment & Plan Note (Addendum)
 Hypertension well-managed with valsartan.  Optimizing heart failure management will aid blood pressure control. - Continue valsartan at current dose while we add carvedilol 6.25 mg twice daily and spironolactone 12.5 mg daily - If BP tolerates in the future would plan to go back to low-dose Entresto from valsartan. - Monitor blood pressure as medications are adjusted.

## 2023-12-26 NOTE — Assessment & Plan Note (Signed)
 No further symptoms irregular heartbeats.  This was in the setting of significant ECF.  Will need to eventually get him back on beta-blocker. -Start carvedilol 6.25 mg twice daily

## 2023-12-26 NOTE — Assessment & Plan Note (Signed)
 Hyperlipidemia requires reassessment and potential treatment adjustment. - Recheck lipid panel in 2 weeks. - Restart cholesterol medication based on lipid panel results.

## 2023-12-26 NOTE — Assessment & Plan Note (Addendum)
 Heart failure with reduced ejection fraction (HFrEF) HFrEF symptoms notably improved while on GDMT medications to the point where for the last year he was doing well.  However symptoms recurred after discontinuation of medications, exacerbated by substance use. Fluid retention and weight gain suggest cardiac function deterioration. - Has not had recent echo evaluation.  - Order echocardiogram to assess current ejection fraction. - Increase furosemide to 40 mg daily with additional dose if weight increases by more than 3 pounds in a day. - Start spironolactone 12.5 mg daily. - Start carvedilol 6.25 mg BID after a few days of increased furosemide and spironolactone. - Recheck chemistry panel and lipids in 2 weeks. - Schedule follow-up in 3 weeks with APP to further titrate medications. - Plan to switch from valsartan to [facility] after ensuring tolerance to current medications. - Advise on low-sodium diet to prevent fluid retention.

## 2024-01-02 NOTE — Progress Notes (Deleted)
 Cardiology Office Note:    Date:  01/02/2024   ID:  [name], DOB 06-05-1981, MRN [id number]  PCP:  Barnie Mort, PA-C  Cardiologist:  Bryan Lemma, MD { Click to update primary MD,subspecialty MD or APP then REFRESH:1}    Referring MD: Barnie Mort, PA-C   Chief Complaint: follow-up of CHF  History of Present Illness:    Mitchell Ochoa is a 43 y.o. male with a history of mild non-obstructive CAD on cardiac catheterization in 12/2021, non-ischemic cardiomyopathy/ chronic HFrEF with EF of 20-25% in 11/2021 ***, non-sustained VT, hypertension, dyslipidemia, substance abuse (alcohol and marijuana), and non-compliance who is followed by Dr. Herbie Baltimore and presents today for routine follow-up.   Patient was initially seen by Cardiology at [facility] in 11/2023 after presenting worsening shortness of breath for several weeks. Echo showed LVEF of 20-25%, mildly reduced RV function, moderately dilated left atrium, and mild MR. LHC in 12/2021 at [facility] showed only mild non-obstructive CAD but LVEDP was elevated at 35 mmHg. He was then admitted at [facility] in 01/2022 for acute on chronic CHF in setting of dietary indiscretion and poor medication compliance. He was noted to have short runs of NSVT on telemetry during that admission. He was diuresed and placed back on GDMT. At outpatient follow-up later that month, cardiac MRI and sleep study were ordered but it does not look like either were ever completed. He was lost to follow-up after this visit.  He was seen in the ED in 11/2023 with symptoms of CHF in setting of medication non-compliance and increased alcohol and marijuana use. He was given a dose of IV Lasix and diuresed 3L. He felt much better after this and wanted to go home. He was referred back to Cardiology.  He was seen by Dr. Herbie Baltimore on 12/22/2023 at which time he reported shortness of breath, erratic heart beat, and orthopnea as well as some chest pressure with physical activity but  symptoms had improved since getting back on medications. He was started on Spironolactone and Coreg and Lasix was increased. Repeat Echo was ordered and showed ***  Patient presents today for follow-up. ***  Mild Non-Obstructive CAD Noted on cardiac catheterization in 12/2021 at [facility].  - No chest pain. *** - No aspirin need at this time given only mild CAD. - He is not currently on a statin. ***  Chronic HFrEF Non-Ischemic Cardiomyopathy Diagnosed in 11/2021 when found to have EF of 20-25%. Most recent Echo on 01/07/2024 showed *** - *** - Continue Lasix 40mg  daily. Can take an extra 40mg  as needed for weight gain and edema. - Currently on Valsartan 40mg  daily. Will stop this and switch to Entresto 24-26mg  twice daily. *** - Continue Coreg 6.25mg  twice daily.  - Continue Spironolactone 12.5mg  daily.  - Will start Farxiga 10mg  daily. *** - Continue daily weights and sodium/ fluid restrictions.  - Cardiac MRI. ***  History of Non-Sustained VT He was noted to have short runs of NSVT on telemetry during admission in 01/2022 for acute CHF.  - No recent palpitations. *** - Continue Coreg 6.25mg  twice daily.   Hypertension BP *** - Continue GDMT for CHF as above.  Hyperlipidemia Lipid panel in *** - Not currently on a statin.   Substance Abuse Patient has a history of alcohol and marijuana use. ***  EKGs/Labs/Other Studies Reviewed:    The following studies were reviewed:  Echocardiogram 12/15/2021: Impressions: Left Ventricle: Left ventricle is mildly dilated.  Systolic function is  severely abnormal. EF: 20-25%.   Left Atrium: Left atrium is moderately dilated.  Right Ventricle: Right ventricle is mildly dilated. Systolic function  is mildly reduced.  Aortic Valve: There is no regurgitation or stenosis.  Mitral Valve: There is mild regurgitation with a centrally directed  jet.  Tricuspid Valve: The right ventricular systolic pressure is mildly  elevated (37-49 mmHg).     No prior study  _______________  Cardiac Catheterization 01/12/2022: Conclusions: Minor nonobstructive coronary artery plaque  Nonischemic cardiomyopathy with severely reduced left ventricular ejection  fraction  Elevated left ventricular end-diastolic filling pressures.  Acute on chronic systolic and diastolic congestive heart failure   EKG:  EKG not ordered today.   Recent Labs: 11/28/2023: ALT 21; B Natriuretic Peptide 401.1; BUN 7; Creatinine, Ser 0.70; Hemoglobin 16.1; Platelets 229; Potassium 4.0; Sodium 140  Recent Lipid Panel No results found for: "CHOL", "TRIG", "HDL", "CHOLHDL", "VLDL", "LDLCALC", "LDLDIRECT"  Physical Exam:    Vital Signs: There were no vitals taken for this visit.    Wt Readings from Last 3 Encounters:  12/22/23 (!) 359 lb 6.4 oz (163 kg)  11/28/23 (!) 345 lb (156.5 kg)  04/13/23 (!) 337 lb (152.9 kg)     General: 43 y.o. male in no acute distress. HEENT: Normocephalic and atraumatic. Sclera clear.  Neck: Supple. No carotid bruits. No JVD. Heart: *** RRR. Distinct S1 and S2. No murmurs, gallops, or rubs.  Lungs: No increased work of breathing. Clear to ausculation bilaterally. No wheezes, rhonchi, or rales.  Abdomen: Soft, non-distended, and non-tender to palpation.  Extremities: No lower extremity edema.  Radial and distal pedal pulses 2+ and equal bilaterally. Skin: Warm and dry. Neuro: No focal deficits. Psych: Normal affect. Responds appropriately.   Assessment:    No diagnosis found.  Plan:     Disposition: Follow up in ***   Signed, Casimer Clear, PA-C  01/02/2024 1:57 PM     [facility]

## 2024-01-06 ENCOUNTER — Encounter (HOSPITAL_BASED_OUTPATIENT_CLINIC_OR_DEPARTMENT_OTHER): Payer: Self-pay

## 2024-01-07 ENCOUNTER — Other Ambulatory Visit (HOSPITAL_BASED_OUTPATIENT_CLINIC_OR_DEPARTMENT_OTHER): Payer: Self-pay

## 2024-01-11 ENCOUNTER — Ambulatory Visit: Payer: Self-pay | Admitting: Student

## 2024-01-26 ENCOUNTER — Encounter (HOSPITAL_BASED_OUTPATIENT_CLINIC_OR_DEPARTMENT_OTHER): Payer: Self-pay

## 2024-01-26 ENCOUNTER — Ambulatory Visit: Payer: Self-pay | Admitting: Emergency Medicine

## 2024-01-26 ENCOUNTER — Other Ambulatory Visit: Payer: Self-pay | Admitting: Emergency Medicine

## 2024-01-26 ENCOUNTER — Encounter: Payer: Self-pay | Admitting: Emergency Medicine

## 2024-01-26 ENCOUNTER — Ambulatory Visit: Payer: Self-pay | Attending: Emergency Medicine | Admitting: Emergency Medicine

## 2024-01-26 VITALS — BP 146/108 | HR 80 | Ht 74.0 in | Wt 350.0 lb

## 2024-01-26 DIAGNOSIS — E785 Hyperlipidemia, unspecified: Secondary | ICD-10-CM

## 2024-01-26 DIAGNOSIS — I428 Other cardiomyopathies: Secondary | ICD-10-CM

## 2024-01-26 DIAGNOSIS — E7849 Other hyperlipidemia: Secondary | ICD-10-CM

## 2024-01-26 DIAGNOSIS — I2510 Atherosclerotic heart disease of native coronary artery without angina pectoris: Secondary | ICD-10-CM

## 2024-01-26 DIAGNOSIS — I4729 Other ventricular tachycardia: Secondary | ICD-10-CM

## 2024-01-26 DIAGNOSIS — I10 Essential (primary) hypertension: Secondary | ICD-10-CM

## 2024-01-26 DIAGNOSIS — F1010 Alcohol abuse, uncomplicated: Secondary | ICD-10-CM

## 2024-01-26 DIAGNOSIS — I5042 Chronic combined systolic (congestive) and diastolic (congestive) heart failure: Secondary | ICD-10-CM

## 2024-01-26 MED ORDER — ENTRESTO 24-26 MG PO TABS: 1.0000 | ORAL_TABLET | Freq: Two times a day (BID) | ORAL | 1 refills | Status: AC

## 2024-01-26 MED ORDER — DAPAGLIFLOZIN PROPANEDIOL 10 MG PO TABS: 10.0000 mg | ORAL_TABLET | Freq: Every day | ORAL | 1 refills | Status: DC

## 2024-01-26 NOTE — Progress Notes (Signed)
 Cardiology Office Note:    Date:  01/26/2024  ID:  Mitchell Ochoa, DOB [birth year], MRN [id number] PCP: Mitchell Ochoa  [facility] Providers Cardiologist:  Mitchell Bustard, MD { Click to update primary MD,subspecialty MD or APP then REFRESH:1}    {Click to Open Review  :1}   Patient Profile:      Chief Complaint: Follow-up chronic HFrEF History of Present Illness:  Mitchell Ochoa is a 43 y.o. male with visit-pertinent history of mild non-obstructive CAD on cardiac catheterization in 12/2021, non-ischemic cardiomyopathy/ chronic HFrEF with EF of 20-25% in 11/2021, non-sustained VT, hypertension, dyslipidemia, substance abuse (alcohol and marijuana), and non-compliance who is followed by Dr. Addie Ochoa and presents today for routine follow-up.   Patient was initially seen by Cardiology at [facility] in 11/2023 after presenting worsening shortness of breath for several weeks. Echo showed LVEF of 20-25%, mildly reduced RV function, moderately dilated left atrium, and mild MR. LHC in 12/2021 at [facility] showed only mild non-obstructive CAD but LVEDP was elevated at 35 mmHg. He was then admitted at [facility] in 01/2022 for acute on chronic CHF in setting of dietary indiscretion and poor medication compliance. He was noted to have short runs of NSVT on telemetry during that admission. He was diuresed and placed back on GDMT. At outpatient follow-up later that month, cardiac MRI and sleep study were ordered but it does not look like either were ever completed. He was lost to follow-up after this visit.  He was seen in the ED in 11/2023 with symptoms of CHF in setting of medication non-compliance and increased alcohol and marijuana use. He was given a dose of IV Lasix  and diuresed 3L. He felt much better after this and wanted to go home. He was referred back to Cardiology.  He was seen by Dr. Addie Ochoa on 12/22/2023 at which time he reported shortness of breath, erratic heart beat, and orthopnea as well as some  chest pressure with physical activity but symptoms had improved since getting back on medications. He was started on Spironolactone  and Coreg  and Lasix  was increased. Repeat Echo was ordered and showed it is currently pending with study date to be completed on 01/28/2024.   Discussed the use of AI scribe software for clinical note transcription with the patient, who gave verbal consent to proceed.  History of Present Illness Mitchell Ochoa is a 43 year old male with heart failure who presents for medication management and follow-up.  Patient arrives to clinic today without acute cardiovascular concerns or complaints.  He notes he is doing well overall.  He has maintained compliance with his current medication regimen.  Denies any chest pains, shortness of breath, orthopnea, PND, leg swelling.  His weight is down 9 pounds over the last month.  He notes he does need to improve his dietary habits.  Not currently taking blood pressure at home.  He consumes alcohol occasionally, primarily wine and beer on weekends, and has significantly reduced smoking cigars and 'black and milds'. He denies current marijuana use. He works at a [employer] and lives close to the medical facility, facilitating appointment attendance.  Review of systems:  Please see the history of present illness. All other systems are reviewed and otherwise negative.     Home Medications:    Current Meds  Medication Sig   albuterol  (VENTOLIN  HFA) 108 (90 Base) MCG/ACT inhaler Inhale 1-2 puffs into the lungs every 6 (six) hours as needed for  wheezing or shortness of breath.   carvedilol  (COREG ) 6.25 MG tablet Take 1 tablet (6.25 mg total) by mouth 2 (two) times daily.   dapagliflozin  propanediol (FARXIGA ) 10 MG TABS tablet Take 1 tablet (10 mg total) by mouth daily.   furosemide  (LASIX ) 40 MG tablet Take 1 tablet (40 mg total) by mouth daily. May take an extra 40 mg if need for  a weight gain of 3 lbs or more over night   GARLIC PO Take 1 capsule by mouth daily.   MAGNESIUM  PO Take 1 tablet by mouth daily.   sacubitril -valsartan  (ENTRESTO ) 24-26 MG Take 1 tablet by mouth 2 (two) times daily.   spironolactone  (ALDACTONE ) 25 MG tablet Take 0.5 tablets (12.5 mg total) by mouth daily.   [DISCONTINUED] valsartan  (DIOVAN ) 40 MG tablet Take 1 tablet (40 mg total) by mouth daily.   Studies Reviewed:       *** Risk Assessment/Calculations:     HYPERTENSION CONTROL Vitals:   01/26/24 1414 01/26/24 1443  BP: (!) 142/110 (!) 146/108    The patient's blood pressure is elevated above target today. {Click here if intervention needs to be changed Refresh Note :1}  In order to address the patient's elevated BP: A new medication was prescribed today.          Physical Exam:   VS:  BP (!) 146/108 (BP Location: Right Arm, Patient Position: Sitting, Cuff Size: Large)   Pulse 80   Ht 6\' 2"  (1.88 m)   Wt (!) 350 lb (158.8 kg)   BMI 44.94 kg/m    Wt Readings from Last 3 Encounters:  01/26/24 (!) 350 lb (158.8 kg)  12/22/23 (!) 359 lb 6.4 oz (163 kg)  11/28/23 (!) 345 lb (156.5 kg)    GEN: Well nourished, well developed in no acute distress NECK: No JVD; No carotid bruits CARDIAC: RRR, no murmurs, rubs, gallops RESPIRATORY:  Clear to auscultation without rales, wheezing or rhonchi  ABDOMEN: Soft, non-tender, non-distended EXTREMITIES:  No edema; No acute deformity     Assessment and Plan:  Mild Non-Obstructive CAD Noted on cardiac catheterization in 12/2021 at [facility].  - Today patient is without any anginal symptoms, no indication for further ischemic evaluation at this time - No no need for aspirin therapy at this time given mild CAD noted on cath - He is not currently on a statin therapy as he has not come back for his fasted lipid panel - Ordered direct LDL  Chronic HFrEF Non-Ischemic Cardiomyopathy Diagnosed in 11/2021 when found to have EF of 20-25%. Repeat  echocardiogram pending with study date 01/28/2024 - Did have history of significant alcohol use - Today patient is euvolemic and well compensated on exam with no reported symptoms suggestive of exacerbation.  He is down 9 LBS over the past month -Plan will be to further optimize GDMT - Will discontinue valsartan  40 mg daily and start Entresto  24-26 mg twice daily - Will start Farxiga  10 mg daily - Continue Lasix  40mg  daily. Can take an extra 40mg  as needed for weight  - Continue Coreg  6.25mg  twice daily.  - Continue Spironolactone  12.5mg  daily.  - Continue daily weights and sodium/ fluid restrictions.  - Will reevaluate need for cardiac MRI based on results of his upcoming echocardiogram  History of Non-Sustained VT He was noted to have short runs of NSVT on telemetry during admission in 01/2022 for acute CHF.  - Today he denies symptoms concerning for recurrent NSVT.  He denies any palpitations - Continue  Carvedilol  6.25mg  twice daily.   Hypertension Blood pressure today is 142/110 and repeat 146/108 BP remains uncontrolled - Continue GDMT for CHF with addition of Entresto  as noted above.  Will likely need to increase Entresto  dose on follow-up visit in order to reach optimal BP control  Hyperlipidemia No recent lipid panel on file.  Has not followed up to get his fasting lipid panel drawn - Direct LDL today - Not currently on a statin - Will reassess need for statin therapy given results of his LDL and history of mild CAD  Substance Abuse Patient has a history of alcohol and marijuana use.  - Alcohol use has significantly decreased and no longer using marijuana Assessment & Plan Heart failure with reduced ejection fraction Chronic heart failure with reduced ejection fraction, previously 20-25%. Symptoms include fluid overload and dyspnea when medications are missed. Current management includes spironolactone , furosemide , and carvedilol . Plan to optimize therapy by adding Entresto  and  Farxiga . Echocardiogram scheduled to assess ejection fraction. - Discontinue valsartan . - Start Entresto  24/26 mg twice daily. - Start Farxiga  once daily in the morning. - Ensure echocardiogram is completed on Friday. - Obtain blood work today and repeat in two weeks to monitor renal function. - Follow up in one month to discuss echocardiogram results and adjust medications if necessary.  Hypertension Blood pressure elevated at 146/108. Plan to manage with Entresto , expected to lower blood pressure. Potential for dose escalation if hypertension persists. - Monitor blood pressure and consider increasing Entresto  dose in one month if hypertension persists.     {Are you ordering a CV Procedure (e.g. stress test, cath, DCCV, TEE, etc)?   Press F2        :[id number]}  Dispo:  Return in about 1 month (around 02/26/2024).  Signed, Ava Boatman, NP

## 2024-01-26 NOTE — Patient Instructions (Addendum)
 Medication Instructions:  STOP TAKING VALSARTAN . START TAKING ENTRESTO  24/26 MG TWICE DAILY. START TAKING FARXIGA  10 MG DAILY.   Lab Work: BMET AND DIRECT LDL TO BE DONE TODAY. REPEAT BMET IN 2 WEEKS.   Testing/Procedures: NONE  Follow-Up: At [facility], you and your health needs are our priority.  As part of our continuing mission to provide you with exceptional heart care, our providers are all part of one team.  This team includes your primary Cardiologist (physician) and Advanced Practice Providers or APPs (Physician Assistants and Nurse Practitioners) who all work together to provide you with the care you need, when you need it.  Your next appointment:   1 month(s)  Provider:   MADISON FOUNTAIN, DNP

## 2024-01-26 NOTE — Progress Notes (Deleted)
 Cardiology Office Note:    Date:  01/26/2024  ID:  Mitchell Ochoa, DOB 1981/08/21, MRN [id number] PCP: Sharalyn Dasen  [facility] Providers Cardiologist:  Randene Bustard, MD { Click to update primary MD,subspecialty MD or APP then REFRESH:1}    {Click to Open Review  :1}   Patient Profile:      Chief Complaint: *** History of Present Illness:  Mitchell Ochoa is a 43 y.o. male with visit-pertinent history of mild non-obstructive CAD on cardiac catheterization in 12/2021, non-ischemic cardiomyopathy/ chronic HFrEF with EF of 20-25% in 11/2021 ***, non-sustained VT, hypertension, dyslipidemia, substance abuse (alcohol and marijuana), and non-compliance who is followed by Dr. Addie Holstein and presents today for routine follow-up.   Patient was initially seen by Cardiology at [facility] in 11/2023 after presenting worsening shortness of breath for several weeks. Echo showed LVEF of 20-25%, mildly reduced RV function, moderately dilated left atrium, and mild MR. LHC in 12/2021 at [facility] showed only mild non-obstructive CAD but LVEDP was elevated at 35 mmHg. He was then admitted at [facility] in 01/2022 for acute on chronic CHF in setting of dietary indiscretion and poor medication compliance. He was noted to have short runs of NSVT on telemetry during that admission. He was diuresed and placed back on GDMT. At outpatient follow-up later that month, cardiac MRI and sleep study were ordered but it does not look like either were ever completed. He was lost to follow-up after this visit.  He was seen in the ED in 11/2023 with symptoms of CHF in setting of medication non-compliance and increased alcohol and marijuana use. He was given a dose of IV Lasix  and diuresed 3L. He felt much better after this and wanted to go home. He was referred back to Cardiology.  He was seen by Dr. Addie Holstein on 12/22/2023 at which time he reported shortness of breath, erratic heart beat, and orthopnea as well as some chest pressure  with physical activity but symptoms had improved since getting back on medications. He was started on Spironolactone  and Coreg  and Lasix  was increased. Repeat Echo was ordered and showed ***  Discussed the use of AI scribe software for clinical note transcription with the patient, who gave verbal consent to proceed.  History of Present Illness     Review of systems:  Please see the history of present illness. All other systems are reviewed and otherwise negative. ***     Home Medications:    No outpatient medications have been marked as taking for the 01/26/24 encounter (Appointment) with Ava Boatman, NP.   Studies Reviewed:       *** Risk Assessment/Calculations:   {Does this patient have ATRIAL FIBRILLATION?:[phone]} No BP recorded.  {Refresh Note OR Click here to enter BP  :1}***       Physical Exam:   VS:  There were no vitals taken for this visit.   Wt Readings from Last 3 Encounters:  12/22/23 (!) 359 lb 6.4 oz (163 kg)  11/28/23 (!) 345 lb (156.5 kg)  04/13/23 (!) 337 lb (152.9 kg)    GEN: Well nourished, well developed in no acute distress NECK: No JVD; No carotid bruits CARDIAC: ***RRR, no murmurs, rubs, gallops RESPIRATORY:  Clear to auscultation without rales, wheezing or rhonchi  ABDOMEN: Soft, non-tender, non-distended EXTREMITIES:  No edema; No acute deformity ***     Assessment and Plan:  Mild Non-Obstructive CAD Noted on cardiac catheterization in 12/2021 at [facility].  - No chest pain. *** -  No aspirin need at this time given only mild CAD. - He is not currently on a statin. ***  Chronic HFrEF Non-Ischemic Cardiomyopathy Diagnosed in 11/2021 when found to have EF of 20-25%. Most recent Echo on 01/07/2024 showed *** - *** - Continue Lasix  40mg  daily. Can take an extra 40mg  as needed for weight gain and edema. - Currently on Valsartan  40mg  daily. Will stop this and switch to Entresto  24-26mg  twice daily. *** - Continue Coreg  6.25mg  twice daily.   - Continue Spironolactone  12.5mg  daily.  - Will start Farxiga  10mg  daily. *** - Continue daily weights and sodium/ fluid restrictions.  - Cardiac MRI. ***  History of Non-Sustained VT He was noted to have short runs of NSVT on telemetry during admission in 01/2022 for acute CHF.  - No recent palpitations. *** - Continue Coreg  6.25mg  twice daily.   Hypertension BP *** - Continue GDMT for CHF as above.  Hyperlipidemia Lipid panel in *** - Not currently on a statin.   Substance Abuse Patient has a history of alcohol and marijuana use. *** Assessment & Plan      {Are you ordering a CV Procedure (e.g. stress test, cath, DCCV, TEE, etc)?   Press F2        :[id number]}  Dispo:  No follow-ups on file.  Signed, Ava Boatman, NP

## 2024-01-27 ENCOUNTER — Telehealth: Payer: Self-pay | Admitting: *Deleted

## 2024-01-27 ENCOUNTER — Telehealth: Payer: Self-pay | Admitting: Pharmacy Technician

## 2024-01-27 ENCOUNTER — Other Ambulatory Visit (HOSPITAL_COMMUNITY): Payer: Self-pay

## 2024-01-27 ENCOUNTER — Encounter: Payer: Self-pay | Admitting: Emergency Medicine

## 2024-01-27 LAB — BASIC METABOLIC PANEL WITH GFR
BUN/Creatinine Ratio: 11 (ref 9–20)
BUN: 8 mg/dL (ref 6–24)
CO2: 25 mmol/L (ref 20–29)
Calcium: 9.1 mg/dL (ref 8.7–10.2)
Chloride: 100 mmol/L (ref 96–106)
Creatinine, Ser: 0.71 mg/dL — ABNORMAL LOW (ref 0.76–1.27)
Glucose: 96 mg/dL (ref 70–99)
Potassium: 4.3 mmol/L (ref 3.5–5.2)
Sodium: 141 mmol/L (ref 134–144)
eGFR: 117 mL/min/{1.73_m2} (ref >59)

## 2024-01-27 LAB — LDL CHOLESTEROL, DIRECT: LDL Direct: 142 mg/dL — ABNORMAL HIGH (ref 0–99)

## 2024-01-27 MED ORDER — JARDIANCE 10 MG PO TABS: 10.0000 mg | ORAL_TABLET | Freq: Every day | ORAL | 2 refills | Status: AC

## 2024-01-27 NOTE — Telephone Encounter (Signed)
 Pharmacy Patient Advocate Encounter   Received notification from Pt Calls Messages that prior authorization for jardiance is required/requested.   Insurance verification completed.   The patient is insured through [employer] .   Per test claim: PA required; PA submitted to above mentioned insurance via CoverMyMeds Key/confirmation #/EOC [id number] Status is pending

## 2024-01-27 NOTE — Telephone Encounter (Signed)
 Insurance does not cover the Farxiga  so Mitchell Ochoa has now started patient on Jardiance 10 mg Daily. New prescription has been sent to the pharmacy. Spoke to patient about change in medication therapy.

## 2024-01-27 NOTE — Telephone Encounter (Signed)
 Per other encounter -this was changed to jardiance based on what was said insurance said. But insurance said jardiance and farxiga  would both needs a pa

## 2024-01-27 NOTE — Telephone Encounter (Signed)
 This was denied stating try metformin. I sent for an appeal

## 2024-01-27 NOTE — Telephone Encounter (Signed)
 I spoke to [facility] and jardiance needs a prior authorization as well. The farxiga  prior auth was sent this morning. I will send for jardiance

## 2024-01-27 NOTE — Telephone Encounter (Signed)
 Pharmacy Patient Advocate Encounter   Received notification from RX Request Messages that prior authorization for farxiga  is required/requested.   Insurance verification completed.   The patient is insured through [facility] .   Per test claim: PA required; PA submitted to above mentioned insurance via CoverMyMeds Key/confirmation #/EOC [facility] Status is pending   Per test claims: farxiga , generic farxiga  and jardiance require prior auth

## 2024-01-28 ENCOUNTER — Other Ambulatory Visit (HOSPITAL_BASED_OUTPATIENT_CLINIC_OR_DEPARTMENT_OTHER): Payer: Self-pay

## 2024-01-28 ENCOUNTER — Other Ambulatory Visit (HOSPITAL_COMMUNITY): Payer: Self-pay

## 2024-01-31 ENCOUNTER — Other Ambulatory Visit (HOSPITAL_COMMUNITY): Payer: Self-pay

## 2024-01-31 NOTE — Telephone Encounter (Signed)
 Faxed more information [phone] 2:15pm And lmom for appeals

## 2024-02-01 ENCOUNTER — Ambulatory Visit: Payer: Self-pay | Admitting: *Deleted

## 2024-02-01 MED ORDER — ROSUVASTATIN CALCIUM 10 MG PO TABS: 10.0000 mg | ORAL_TABLET | Freq: Every day | ORAL | 3 refills | Status: AC

## 2024-02-02 ENCOUNTER — Other Ambulatory Visit (HOSPITAL_COMMUNITY): Payer: Self-pay

## 2024-02-02 NOTE — Telephone Encounter (Signed)
 I called insurance and they said still in process

## 2024-02-04 ENCOUNTER — Telehealth: Payer: Self-pay | Admitting: Pharmacy Technician

## 2024-02-04 ENCOUNTER — Other Ambulatory Visit (HOSPITAL_COMMUNITY): Payer: Self-pay

## 2024-02-04 NOTE — Telephone Encounter (Signed)
   Got patient a coupon. Called [facility] [phone]  433.13 FOR JARDIANCE  WITH COUPON

## 2024-03-06 ENCOUNTER — Ambulatory Visit (HOSPITAL_COMMUNITY): Payer: Self-pay | Attending: Cardiology

## 2024-03-07 ENCOUNTER — Encounter (HOSPITAL_COMMUNITY): Payer: Self-pay | Admitting: Cardiology

## 2024-03-09 ENCOUNTER — Ambulatory Visit: Payer: Self-pay | Attending: Emergency Medicine | Admitting: Emergency Medicine

## 2024-03-14 ENCOUNTER — Ambulatory Visit: Payer: Self-pay | Admitting: Emergency Medicine

## 2024-05-08 ENCOUNTER — Encounter: Payer: Self-pay | Admitting: *Deleted

## 2024-10-03 ENCOUNTER — Other Ambulatory Visit: Payer: Self-pay | Admitting: Cardiology

## 2024-10-19 ENCOUNTER — Telehealth: Payer: Self-pay | Admitting: Cardiology

## 2024-10-19 NOTE — Telephone Encounter (Signed)
 Pt requesting a c/b in regards to previous phone note.

## 2024-10-19 NOTE — Telephone Encounter (Signed)
 Called spoke with pt and significant other.  Pt c/o increased swelling/ abdomen hard, SOB, and decreased appetite may eat 1 meal a day.  Pt had OV 01/26/24 with request to return in 1 month.  Pt has not had f/u.   Reviewed cardiac medications.  The only cardiac meds pt reports taking is furosemide  40 mg- 80 mg daily, spironolactone  12.5 mg daily and carvedilol  6.25 mg twice daily.   Advised med list includes Jardiance  and Entresto  which are specific for HF.    Pt does not weigh daily.  Sleeps propped up on pillows at times.  Reports some days UOP is good other days not as good.  Reports is staying hydrated.  Scheduled OV for 10/30/24 at 8 am with Dr. Anner; offered sooner appointment however d/t work schedule could not accept.  Advised of ED precautions.

## 2024-10-19 NOTE — Telephone Encounter (Signed)
 States has been taking furosemide  but fluid isnt going anywhere, she would like a c/b, please advise.

## 2024-10-19 NOTE — Telephone Encounter (Signed)
"  Left message to return the call   "

## 2024-10-30 ENCOUNTER — Ambulatory Visit: Payer: Self-pay | Admitting: Cardiology
